# Patient Record
Sex: Male | Born: 1945 | Race: Black or African American | Hispanic: No | Marital: Single | Smoking: Never smoker
Health system: Southern US, Community
[De-identification: ages and names within clinical notes are randomized; demographics above are authoritative.]

## PROBLEM LIST (undated history)

## (undated) ENCOUNTER — Emergency Department (HOSPITAL_COMMUNITY): Payer: Medicare HMO | Source: Home / Self Care

## (undated) DIAGNOSIS — T7840XA Allergy, unspecified, initial encounter: Secondary | ICD-10-CM

## (undated) DIAGNOSIS — G709 Myoneural disorder, unspecified: Secondary | ICD-10-CM

## (undated) DIAGNOSIS — C801 Malignant (primary) neoplasm, unspecified: Secondary | ICD-10-CM

## (undated) DIAGNOSIS — M1990 Unspecified osteoarthritis, unspecified site: Secondary | ICD-10-CM

## (undated) DIAGNOSIS — I10 Essential (primary) hypertension: Secondary | ICD-10-CM

## (undated) DIAGNOSIS — H269 Unspecified cataract: Secondary | ICD-10-CM

## (undated) DIAGNOSIS — N189 Chronic kidney disease, unspecified: Secondary | ICD-10-CM

## (undated) DIAGNOSIS — E785 Hyperlipidemia, unspecified: Secondary | ICD-10-CM

## (undated) DIAGNOSIS — E119 Type 2 diabetes mellitus without complications: Secondary | ICD-10-CM

## (undated) DIAGNOSIS — K219 Gastro-esophageal reflux disease without esophagitis: Secondary | ICD-10-CM

## (undated) DIAGNOSIS — H409 Unspecified glaucoma: Secondary | ICD-10-CM

## (undated) DIAGNOSIS — N289 Disorder of kidney and ureter, unspecified: Secondary | ICD-10-CM

## (undated) HISTORY — DX: Chronic kidney disease, unspecified: N18.9

## (undated) HISTORY — PX: NEPHRECTOMY: SHX65

## (undated) HISTORY — DX: Unspecified glaucoma: H40.9

## (undated) HISTORY — DX: Essential (primary) hypertension: I10

## (undated) HISTORY — PX: FOOT SURGERY: SHX648

## (undated) HISTORY — DX: Unspecified osteoarthritis, unspecified site: M19.90

## (undated) HISTORY — DX: Myoneural disorder, unspecified: G70.9

## (undated) HISTORY — DX: Gastro-esophageal reflux disease without esophagitis: K21.9

## (undated) HISTORY — DX: Type 2 diabetes mellitus without complications: E11.9

## (undated) HISTORY — DX: Unspecified cataract: H26.9

## (undated) HISTORY — DX: Allergy, unspecified, initial encounter: T78.40XA

## (undated) HISTORY — DX: Hyperlipidemia, unspecified: E78.5

---

## 1999-07-06 ENCOUNTER — Encounter: Admission: RE | Admit: 1999-07-06 | Discharge: 1999-07-29 | Payer: Self-pay | Admitting: Orthopedic Surgery

## 1999-10-26 ENCOUNTER — Encounter: Admission: RE | Admit: 1999-10-26 | Discharge: 1999-10-26 | Payer: Self-pay | Admitting: Nephrology

## 1999-10-26 ENCOUNTER — Encounter: Payer: Self-pay | Admitting: Nephrology

## 2001-04-01 ENCOUNTER — Encounter: Payer: Self-pay | Admitting: Orthopedic Surgery

## 2001-04-01 ENCOUNTER — Encounter: Admission: RE | Admit: 2001-04-01 | Discharge: 2001-04-01 | Payer: Self-pay | Admitting: Orthopedic Surgery

## 2003-11-09 ENCOUNTER — Ambulatory Visit (HOSPITAL_COMMUNITY): Admission: RE | Admit: 2003-11-09 | Discharge: 2003-11-09 | Payer: Self-pay | Admitting: Gastroenterology

## 2003-11-09 ENCOUNTER — Encounter (INDEPENDENT_AMBULATORY_CARE_PROVIDER_SITE_OTHER): Payer: Self-pay | Admitting: Specialist

## 2006-08-31 ENCOUNTER — Ambulatory Visit (HOSPITAL_COMMUNITY): Admission: RE | Admit: 2006-08-31 | Discharge: 2006-08-31 | Payer: Self-pay | Admitting: Urology

## 2007-02-21 ENCOUNTER — Ambulatory Visit (HOSPITAL_COMMUNITY): Admission: RE | Admit: 2007-02-21 | Discharge: 2007-02-21 | Payer: Self-pay | Admitting: Urology

## 2007-06-10 ENCOUNTER — Inpatient Hospital Stay (HOSPITAL_COMMUNITY): Admission: RE | Admit: 2007-06-10 | Discharge: 2007-06-13 | Payer: Self-pay | Admitting: Urology

## 2007-06-10 ENCOUNTER — Encounter: Payer: Self-pay | Admitting: Urology

## 2008-01-14 ENCOUNTER — Inpatient Hospital Stay (HOSPITAL_COMMUNITY): Admission: EM | Admit: 2008-01-14 | Discharge: 2008-01-19 | Payer: Self-pay | Admitting: Emergency Medicine

## 2008-01-15 ENCOUNTER — Ambulatory Visit: Payer: Self-pay | Admitting: Infectious Disease

## 2008-02-04 ENCOUNTER — Ambulatory Visit: Payer: Self-pay | Admitting: Infectious Diseases

## 2008-02-04 DIAGNOSIS — L03119 Cellulitis of unspecified part of limb: Secondary | ICD-10-CM

## 2008-02-04 DIAGNOSIS — L02619 Cutaneous abscess of unspecified foot: Secondary | ICD-10-CM | POA: Insufficient documentation

## 2008-02-05 ENCOUNTER — Encounter: Payer: Self-pay | Admitting: Infectious Diseases

## 2008-02-07 ENCOUNTER — Encounter (HOSPITAL_BASED_OUTPATIENT_CLINIC_OR_DEPARTMENT_OTHER): Admission: RE | Admit: 2008-02-07 | Discharge: 2008-03-17 | Payer: Self-pay | Admitting: Surgery

## 2008-05-12 ENCOUNTER — Encounter: Payer: Self-pay | Admitting: Infectious Diseases

## 2008-05-15 ENCOUNTER — Encounter (HOSPITAL_BASED_OUTPATIENT_CLINIC_OR_DEPARTMENT_OTHER): Admission: RE | Admit: 2008-05-15 | Discharge: 2008-05-26 | Payer: Self-pay | Admitting: Internal Medicine

## 2008-06-01 ENCOUNTER — Encounter (HOSPITAL_BASED_OUTPATIENT_CLINIC_OR_DEPARTMENT_OTHER): Admission: RE | Admit: 2008-06-01 | Discharge: 2008-08-27 | Payer: Self-pay | Admitting: Internal Medicine

## 2008-06-08 ENCOUNTER — Telehealth: Payer: Self-pay | Admitting: Infectious Diseases

## 2008-09-03 ENCOUNTER — Encounter (HOSPITAL_BASED_OUTPATIENT_CLINIC_OR_DEPARTMENT_OTHER): Admission: RE | Admit: 2008-09-03 | Discharge: 2008-11-30 | Payer: Self-pay | Admitting: Internal Medicine

## 2010-03-09 ENCOUNTER — Ambulatory Visit (HOSPITAL_COMMUNITY): Admission: RE | Admit: 2010-03-09 | Discharge: 2010-03-09 | Payer: Self-pay | Admitting: Urology

## 2010-03-30 ENCOUNTER — Ambulatory Visit (HOSPITAL_COMMUNITY): Admission: RE | Admit: 2010-03-30 | Discharge: 2010-03-30 | Payer: Self-pay | Admitting: Urology

## 2010-09-18 ENCOUNTER — Encounter: Payer: Self-pay | Admitting: Urology
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
## 2011-01-10 NOTE — Consult Note (Signed)
NAMEABDULA, KLEBAN             ACCOUNT NO.:  [id number]   MEDICAL RECORD NO.:  [id number]           PATIENT TYPE:   LOCATION:                                 FACILITY:   PHYSICIAN:  Epifania Gore. Nils Pyle, M.D.DATE OF BIRTH:  09-07-1945   DATE OF CONSULTATION:  02/13/2008  DATE OF DISCHARGE:                                 CONSULTATION   SUBJECTIVE:  Mr. Rajkumar is a 65 year old man referred by Dr. Melina Schools for evaluation of a post-traumatic amputation of the first,  second, and third digits of the right foot.   ASSESSMENT:  Postop amputations with a clean surgical wound.   RECOMMENDATIONS:  Proceed with postoperative care as directed by Dr.  Duane Lope.  The patient is not currently a candidate for adjunctive  hyperbaric oxygen therapy and it appears that this wound should heal  within the global period of post operative management.  We have  explained this clinical impression to the patient in terms that he seems  to understand.   SUBJECTIVE:  Mr. Schmelzle is a 65 year old man who was seen in the [facility] Emergency Room 1 month ago with injury to his right foot from a  lawnmower.  The patient ultimately underwent completed amputations of  the first, second, and third digits of the right foot.  He was  discharged on p.o. antibiotics and a protective Cam walker.  He has been  seen in follow up on one occasion.  There has been no excessive  drainage, malodor, pain, or fever.   His past medical history is remarkable for a diagnosis of prostate  cancer and he has undergone a radical right nephrectomy approximately a  year and half ago.  His primary care physician is Dr. Ricke Hey  who he last saw approximately a year and half ago.  He was prescribed  Levaquin 750 mg one p.o. daily but could not afford the medication. He  has since started a course of Cipro and Bactrim.    He denies allergies.  His current medications include promethazine 25 mg p.o. q.6 h.,  methocarbamol 500 mg t.i.d. p.r.n., oxycodone/APAP 10 mg - 325 one to  two every 6 hours p.r.n., ciprofloxacin 500 mg b.i.d., and Bactrim DS  800 mg/160 p.o. b.i.d.   His family history is positive for breast cancer.   Socially, he is single.  He has adult children.  He is currently  unemployed.  He is receiving unemployment benefits.  He lives alone.   On review of systems, he has never smoked.  He denies visual changes or  transient paralysis.  He has had a weight loss of over 70 pounds over  the last year and half because of major changes in his diet.  He denies  chest pain or SOB.  There is no dysuria or hematuria.  There are no  arthralgias or myalgias.  He denies polydipsia, polyuria, or polyphagia.  His last PSA was 9.  The remainder of his review of systems is negative.   On physical exam, he is alert and anxious man who appears to be in good  contact with reality.  He is elusive on aspects of his history which  attempt to ascertain his current providers.  His blood pressure is  123/80, respirations 18, pulse rate 108, and temperature 98.5.  Inspection:  The HEENT exam is clear.  The neck is supple.  Trachea is  midline.  Thyroid is nonpalpable.  The lungs are clear.  The heart  sounds were normal.  The abdomen is soft.  There is bilateral 3+  dorsalis pedis pulses.  There is a surgical incision on the right foot  consistent with the amputations of the first, second, and third digits.  There is a darkened eschar with subdermal hemorrhage, but there is no  drainage and no malodor.  Neurologically, the patient retains protective  sensation as judged by the Semmes-Weinstein filament.  There is no  regional adenopathy.   DISCUSSION:  Mr. Bochenek apparently has had a completion amputations  which were initiated with trauma from a lawnmower injury.  He is  currently 1 month following the surgical procedure and continues on  antibiotics and a protective Cam walker.  There is no  evidence of a  wound complication at this point.  We have reassured the patient that it  appears that these injuries should resolve without the need for advanced  wound care techniques.  We have encouraged him to be compliant with Dr.  Valla Leaver instructions to avoid recurrent trauma to stay on the  antibiotics.  We have expressed a willingness to continue managing him  once he is beyond his obligatory global period related to his surgery.  Specifically, he is not a candidate for hyperbaric oxygen at this point  or other advance wound care procedures.   We have given the patient the opportunity to ask questions.  He seems to  understand the clinical impression and the recommendation.  He expresses  gratitude for having been seen in the clinic and indicates that he will  be compliant.      Harold A. Nils Pyle, M.D.  Electronically Signed     HAN/MEDQ  D:  02/13/2008  T:  02/14/2008  Job:  [id number]   cc:   Dahlia Bailiff, MD

## 2011-01-10 NOTE — Assessment & Plan Note (Signed)
[facility]   NAME:  [name], [name]             ACCOUNT NO.:  [id number]   MEDICAL RECORD NO.:  [id number]      DATE OF BIRTH:  1946/04/12   PHYSICIAN:  Ricard Dillon, M.D.      VISIT DATE:                                   OFFICE VISIT   [name] is an unfortunate man who suffered a foot versus lawnmower  accident to his right foot.  This resulted in amputations of multiple  toes on the right foot and it has left him with a wound in the base of  these toes.  The wound has had a problematic tendency to form a thick  yellow adherent eschar, which responds nicely to Santyl and debridement.  However, when I have tried to change this in the past, the eschar has  come back with a vengeance.  He returns today having simply been washing  this area with antibacterial soap, applying topical antibiotics, and a  bulky dressing and Ace wrap which he has been changing himself.   PHYSICAL EXAMINATION:  His temperature is 98.3.  The amputation site  measures 2.3 x 6 x 0.2.  This is an irregular wound at present.  The  medial aspect of this has more depth than the rest of the wound.  Once  again, this required a nonexcisional debridement to remove the eschar,  especially from this medial aspect of the wound, but also other areas.  Afterwards, the base of the wound appears granulating.  There are some  attempts at epithelialization, although all of this does not look too  much different from his last visit.   IMPRESSION:  Traumatic wound, right foot.  He is not a diabetic.  I have  applied Puracol, hydrogel, a bulky dressing, and an Ace wrap to this.  If we stall in terms of healing here, consider Oasis especially to the  medial aspect of this wound.  I should note that there was probably a  small amount of tendon joint through one aspect of this wound.  No  evidence of infection or ischemia.  We will see him again next week.            ______________________________  Ricard Dillon, M.D.     MGR/MEDQ  D:  06/29/2008  T:  06/29/2008  Job:  [id number]

## 2011-01-10 NOTE — Assessment & Plan Note (Signed)
[facility]   NAME:  [name], [name]             ACCOUNT NO.:  [id number]   MEDICAL RECORD NO.:  [id number]      DATE OF BIRTH:  [birth year]   PHYSICIAN:  Kathrin Penner, M.D.         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  Current dimensions 0.8 x 2.4 x 0.2 cm.  The wound  base is now fully granulated.  There are some callus formation around  the wound edge.  There is no odor or pain or drainage.   The patient is feeling generally well without any specific complaints  referable to his foot.  He thinks the wound is healing satisfactorily  finally.   I did selective debridement by removing the surrounding callus of the  wound.  The wound bed inspected, we will put above the dressing on and  continue him on daily washings with antibacterial soap and re-dressing  with a dry dressing.  I will follow up with him in approximately 3  weeks.  I anticipate that the wound will be healed by that time.      Kathrin Penner, M.D.  Electronically Signed     PB/MEDQ  D:  10/05/2008  T:  10/06/2008  Job:  [id number]

## 2011-01-10 NOTE — Discharge Summary (Signed)
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   SERVICE:  Dahari D. Rolena Infante, MD, Orthopedics Service.   CONSULTS:  Infectious disease consult was obtained.   PROCEDURE:  Completion amputation of the first, second and third toes  and a formal incision and drainage and wound closure.   BRIEF HISTORY:  [name] is a very pleasant 65 year old gentleman who  was mowing his lawn earlier in the afternoon of Jan 14, 2008.  Unfortunately, he states he slipped, fell on a rock and fell into the  mower and it rolled back onto his right foot.  He had immediate pain and  deformity.  He contacted a friend who notified the EMS and he was  brought to the emergency room.  He initially presented into the  emergency room with a significant soft tissue laceration with amputation  of the first, second and third toes and a orthopedic consultation was  requested.  Dr. Rolena Infante came and evaluated the patient in the operating  room and the patient was deemed stable to proceed with completion of his  amputation and wound washout and closure of his lacerations.   HOSPITAL COURSE:  The patient's hospital course was approximately 5 days  in length.  After the patient's initial surgery on Jan 14, 2008, the  patient was transferred from the PACU to the ortho floor in a stable  condition.  The patient tolerated the procedure very well with no  complications.  The patient was initially placed on Ancef and  gentamicin  to be dosed up per pharmacy to cover for an osteomyelitis.  An  Infectious Disease consult was obtained and it was recommended that he  be changed to Zosyn and vancomycin.  Postoperatively on day #2, the  patient's dressings were taken down.  The incision appeared clean, dry  and intact.  There was no erythema present.  The patient was able to  move his fourth and fifth toes, and there was good capillary refill.  The remainder of his distal pulses remained intact.  The patient was  placed in a Cam Walker, and the patient was instructed to work with  physical therapy and ambulating with minimal weightbearing in the [employer].  Throughout his stay, the patient's dressings remained dry with  no drainage present.  The patient remained afebrile.  His vital signs  were stable, and the patient was gradually transitioned off IV  antibiotics on to oral Levaquin.  Postoperatively at day #5, the patient  was deemed stable to go home.  He was tolerating  a regular diet, heart  rate was regular rate and rhythm.  Chest was clear to auscultation.  Abdomen was soft and nontender.  The patient had no episodes of  shortness of breath or chest pain.  The patient was then neurovascularly  intact and the patient was afebrile and vital signs were stable.   DISPOSITION:  The patient is discharged to home with a home health nurse  for regular wound checks.   MEDICATIONS:  The patient did not have any current medications when he  arrived in the hospital.  The patient is being discharged on  1. Percocet 10/325 one-two tablets p.o. q.6 h. p.r.n. pain.  2. Robaxin 500 mg one tablet p.o. t.i.d. p.r.n. muscle spasms.  3. Levaquin 750 mg one tablet p.o. daily for 14 days.   DISCHARGE INSTRUCTIONS:  The patient is to remain in his Cam Walker.  He  is to use his crutches.  The patient is not to bear weight until he  follows up in the office in 4 days.  The patient is allowed to have a  regular diet.  The  patient is to have daily dressing changes and the  patient is to continue taking his antibiotic and return to Dr. Rolena Infante'  office in 4 days for follow-up.      Jacob Kays, PA      Dahlia Bailiff, MD  Electronically Signed    AC/MEDQ  D:  03/06/2008  T:  03/07/2008  Job:  [id number]

## 2011-01-10 NOTE — H&P (Signed)
NAMEMARLO, [name]             ACCOUNT NO.:  [id number]   MEDICAL RECORD NO.:  [id number]          PATIENT TYPE:  INP   LOCATION:  1405                         FACILITY:  [facility]   PHYSICIAN:  Lillette Boxer. Dahlstedt, M.D.DATE OF BIRTH:  [birth year]   DATE OF ADMISSION:  06/10/2007  DATE OF DISCHARGE:                              HISTORY & PHYSICAL   REASON FOR ADMISSION:  Here for surgery.   PRESENT ILLNESS:  Was done in this 65 year old male who was initially  evaluated for an elevated PSA.  This was 10.7.  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  He  underwent a bone scan which was negative.  Because of the high volume of  disease, CT scan of the abdomen and pelvis was obtained.  This revealed  a very large right cystic renal mass with a simple upper pole cyst.  This was in the right kidney.  There was no associated metastatic  disease.  The patient was asymptomatic.  He opted for an attempt at  right laparoscopic radical nephrectomy.  The patient denies any current  voiding symptoms.  He has opted nonoperative management for his prostate  cancer.   REVIEW OF SYSTEMS:  Multisystem reviews performed is negative for all  symptoms except as in HPI.   ALLERGIES:  NO KNOWN DRUG ALLERGIES.   MEDICATIONS:  1. Saw palmetto  2. Vitamin C.  3. Vitamin D.  4. Avodart.  Past.   MEDICAL HISTORY:  Prostate cancer.   PAST SURGICAL HISTORY:  None.   SOCIAL HISTORY:  The patient denies any ethanol or tobacco abuse.  He is  unmarried.   FAMILY HISTORY:  Negative for diabetes, hypertension, genitourinary  malignancy.   PHYSICAL EXAMINATION:  VITAL SIGNS:  Afebrile with stable vitals.  GENERAL:  This is an [place] American gentleman in no acute distress.  HEENT:  The pupils are equally round, reactive to light.  NECK: Supple.  No lymphadenopathy.  CHEST: Clear to auscultation.  CARDIAC:  Regular rate rhythm.  ABDOMEN:  Soft, nontender,  nondistended.  There is a palpable mass in  the right upper quadrant at the level of the costal margin.  This  appears to be mobile.  GENITOURINARY:  Penis is uncircumcised.  Testes descended bilaterally.  Normal in contour.  EXTREMITIES: Warm and well-perfused.  SKIN:  No rashes or lesions.   IMAGING:  Contrasted CT scan of the abdomen and pelvis is reviewed with  the findings as above, namely a large right renal mass extending down  near the level of the iliac crest.   PLAN:  The patient will be taken to the operating room for an attempt at  right laparoscopic radical nephrectomy.  He will be admitted  postoperatively for a period of observation.  The patient has been  informed of the risks and benefits of procedure.  Signed consent form,  found on the chart.     ______________________________  Danella Deis. Dahlstedt, M.D.  Electronically Signed    [name]  D:  06/10/2007  T:  06/11/2007  Job:  [id number] 

## 2011-01-10 NOTE — H&P (Signed)
[name], DU             ACCOUNT NO.:  [id number]   MEDICAL RECORD NO.:  [id number]          PATIENT TYPE:  INP   LOCATION:  5013                         FACILITY:  [facility]   PHYSICIAN:  Dahlia Bailiff, MD    DATE OF BIRTH:  1945-08-31   DATE OF ADMISSION:  01/14/2008  DATE OF DISCHARGE:                              HISTORY & PHYSICAL   ADMITTING DIAGNOSES:  Traumatic amputation of the right foot.   HISTORY:  [name] is a very pleasant 65 year old gentleman who is  otherwise healthy.  He was mowing his lawn earlier this afternoon.  He  states that he slipped on a rock, fell in the mower, rolled back onto  his right foot.  He had immediate pain and deformity.  He contacted a  friend who brought him to the emergency room.  He presents with a  significant soft tissue laceration with amputation of the first, second,  and third toes.  As a result of this, orthopedic consultation was  requested.   The patient's last meal was approximately at 11 o'clock this morning.  He has a history of questionable renal cancer versus hydronephrosis.  He  is unclear, but he has had a right kidney nephrectomy.  He is otherwise  healthy.  No other significant medical problems.  He has had a previous  tracheotomy 30 years ago secondary to trauma.  He is otherwise healthy.  He denies hypertension, coronary artery disease, diabetes, and pulmonary  issues.   PAST MEDICAL HISTORY:   MEDICATIONS:  He is on no known medications.   ALLERGIES:  He denies any drug allergies.   PHYSICAL EXAMINATION:  GENERAL:  He is a pleasant gentleman who appears  his stated age.  He is in no acute distress.  He is comfortable at  present.  He has no hip, knee, or ankle pain on the right side with  range of motion and palpation.  LUNGS:  He denies any shortness of breath or chest pain.  ABDOMEN:  Soft and nontender.  EXTREMITIES:  He has intact dorsalis pedis and posterior tibialis  pulses.  After removing the  dressing, the fourth and fifth toes are  grossly intact.  It did not appear to be involved.  He is able to move  them.  He has sensation to light touch over the dorsum and sole of his  foot.  There is a traumatic amputation of the second and third what  appears to be MTP head, it is unclear because of the bleeding.  The  first toe has been completely amputated and there is a soft tissue  laceration that comes along the medial border of the foot.   PLAN:  At this point in time, I have discussed treatment options with  the patient.  Reimplantation is not an option as he was requesting.  At  this point, my plan would be to take him to the operating room to do a  thorough irrigation and debridement to decrease his risk of infection  because of the questionable soil contamination, also request pharmacy  for two-day gentamicin dosing.  I will  have him evaluated by the wound  nurse tomorrow and will plan on a repeat  I&D in 24 hours.  We will plan on doing a formal I&D and a temporary  wound closure tonight.  All the risks, benefits, and alternatives to  surgery were discussed with the patient.  He consented to the completion  amputation and I&D.      Dahlia Bailiff, MD  Electronically Signed     DDB/MEDQ  D:  01/14/2008  T:  01/15/2008  Job:  [id number]

## 2011-01-10 NOTE — Op Note (Signed)
NAMETYRANCE, BRANIGAN             ACCOUNT NO.:  [id number]   MEDICAL RECORD NO.:  [id number]          PATIENT TYPE:  INP   LOCATION:  1405                         FACILITY:  [facility]   PHYSICIAN:  Lillette Boxer. Dahlstedt, M.D.DATE OF BIRTH:  Sep 27, 1945   DATE OF PROCEDURE:  06/10/2007  DATE OF DISCHARGE:                               OPERATIVE REPORT   PREOPERATIVE DIAGNOSIS:  Right renal mass.   POSTOPERATIVE DIAGNOSIS:  Right renal mass.   PROCEDURES PERFORMED:  Attempted right laparoscopic radical nephrectomy,  conversion to open radical nephrectomy.   SURGEON:  Lillette Boxer. Diona Fanti, M.D.   ASSISTANT:  Aron Baba, M.D.   ANESTHESIA:  General endotracheal.   ESTIMATED BLOOD LOSS:  400 mL.   COMPLICATIONS:  Conversion to open.   DRAIN:  An 70 French Foley catheter.   INDICATIONS FOR PROCEDURE:  Please see full history of physical dated  June 10, 2007, for details.  Briefly, Mr. Buehring has a large right  renal mass that was discovered on metastatic survey for low volume  prostate cancer for which he has chosen watchful waiting.  He does  desires surgical extirpation of his renal mass.  He presents for that  today.  His metastatic service is negative.   DESCRIPTION OF PROCEDURE IN DETAIL:  The patient is brought to the  operating, previously identified by his armband.  Informed consent was  verified and preoperative time-out was performed.  After the successful  induction of general endotracheal anesthesia, a Foley catheter was  inserted transurethrally into the bladder and placed to straight drain.  The patient was then moved to the flank position.  The table was flexed.  All appropriate pressure points were padded to avoid apraxic compartment  syndrome.  The operative site was prepped and draped in usual fashion.  We began by making a 12 mm infraumbilical incision.  Blunt and sharp  dissection was used to carry this down to the external oblique fascia  which was  incised with Bovie cautery.  We then placed a 12 mm Hasson  port.  We then established pneumoperitoneum with carbon dioxide.  Once  this was completed, we placed a right lower quadrant 12 mm port and then  a 5 mm working port in the midclavicular line.  We then performed  laparoscopy.  Upon entering the abdomen there were no adhesions.  There  was a very large mass arising from the right kidney.  It was adherent to  the colon.  We did identify an area where there was normal anatomy  inferiorly.  We incised the white line of Toldt and developed the  avascular plane using the Harmonic scalpel as well as blunt dissection.  We then followed this plane onto the kidney and dissected the colon off  of the kidney.  This was placed on traction.  Because of limited  mobility, we placed a second 5 mm port just below the xiphoid process.  This was used to retract the colon medially.  We then proceeded to  further mobilize the colon off of the kidney with particular attention  to the inferior pole.  Once  adequate reflection of colon had been  obtained, the dissection was carried laterally and posteriorly toward  the psoas major muscle.  We identified the gonadal vessels and the right  ureter.  These were placed on the anterior traction.  There were  dissected and skeletonized.  They were then clipped with Hem-o-lok clips  and divided.  We then placed the kidney on this much upper traction as  possible and developed the posterior plane.  This was difficult  secondary to the size of the kidney and the tumor. There was difficulty  gaining much anterior traction.  We then identified the duodenum.  This  was kocherized.  Once the duodenum was reflected, we continued from a  caudal to cephalad direction in the posterior plane in an attempt to  find the vessels.  Again, it was very difficult to identify the vessels  in a safe manner because of the lack of traction.  We attempted to  mobilize the kidney  further to allow for more traction and adequate  exposure.  We divided the lateral attachments between Gerota's fascia  and the peritoneum.  We divided the attachments between the liver and  the kidney superiorly as much as possible.  Again, this difficult to  visualize.  We then proceeded with the superior dissection and spared  the adrenal by entering Gerota's fascia medially and superiorly and  developing the plane between the renal capsule and the adrenal within  Gerota's fascia.  Having completed all this mobilization, we again  returned to the presumptive renal hilum.  We were unable to visualize it  in a satisfactory manner.  The decision was made at this point to insert  a GelPort to continue in a hand assisted laparoscopic fashion.  The  right lower quadrant incision was extended to 8 cm.  This was carried  down through the fascia under direct vision.  The GelPort was placed.  We again tried for a significant amount of time to gain adequate  exposure of the renal hilum.  The kidney was nearly completely mobilized  at this point, but because of inadequate working space due to the size  of the tumor, we were unable to identify the renal hilum, in particular,  the right renal artery.  The decision was then made to convert to an  open procedure.  An anterior subcostal incision was made and a  Bookwalter retractor was placed.  We were able to complete the medial  dissection.  We identified the vena cava.  We identified the renal vein  coming off of the vena cava.  The anatomy was somewhat distorted in  caudal fashion due to the size of this tumor.  All of the attachments  were freed up and the kidney remained on the pedicle.  We could identify  the vena cava as well as the aorta.  We identified the presumptive renal  pedicle.  At this time, the decision was made to complete the  nephrectomy with an Endo-GIA stapler.  We used a 55 mm Endo-GIA vascular  load.  It was placed across the  renal hilum.  We inspected the vena cava  as well as the duodenum.  We verified that no structures other than the  renal hilum were enclosed within the stapler.  It was then fired.  The  kidney was freely mobile.  We were able to remove the kidney, although  we did have to extend our subcostal incision due to the size.  We  inspected  the hilum and hemostasis was excellent.  The wound was  irrigated.  The infraumbilical fascia was closed with interrupted  mattress sutures of 0 Vicryl.  Both the hand port and the anterior  subcostal incision were closed in two layers with running #1 PDS.  Subcutaneous portions of the wound were irrigated and the skin was  closed with surgical staples including the remaining laparoscopic port.  At this time the procedure was terminated.  The patient tolerated the  procedure well.  He was transferred to the postanesthesia care unit in  satisfactory condition.     ______________________________  Aron Baba, M.D.      Lillette Boxer. Dahlstedt, M.D.  Electronically Signed    JR/MEDQ  D:  06/10/2007  T:  06/11/2007  Job:  [id number]

## 2011-01-10 NOTE — Assessment & Plan Note (Signed)
[facility]   NAME:  [name], [name]             ACCOUNT NO.:  [id number]   MEDICAL RECORD NO.:  [id number]      DATE OF BIRTH:  [birth year]   PHYSICIAN:  Ermalene Searing. Philip Aspen, M.D.     VISIT DATE:                                   OFFICE VISIT   SUBJECTIVE:  The patient is a 65 year old man of African American  descent who is status post a lawnmower accident to his right foot that  resulted in traumatic amputation of multiple toes including the great  toe and left him with a wound at the base of these toes.  At his last  visit, he was treated with Oasis covered by Mepitel, silver alginate  dressing, and a bulky gauze dressing.  He has continued to use a healing  sandal.  He has not had significant pain in the foot or fever or chills.   OBJECTIVE:  VITAL SIGNS:  Blood pressure 133/84, pulse 99, respirations  20, temperature 98.  The condition of the wound is as follows:  Wound #2:  It is located on the stump of the right foot, traumatic  amputation of multiple toes.  It measures 2 cm x 5.6 cm x no significant  depth.  There is no undermining of the wound.  There is no significant  eschar and minimal necrotic debris, primarily on the periphery of the  wound.  There is a red wound base and hypergranulation tissue.  There is  no exposed bone, tendon, or muscle.  There was no foul odor and a scant  amount of serosanguineous discharge.  The periwound area was intact.   IMPRESSION:  Interval marked improvement in appearance of the wound with  wound healing stimulated by the first Oasis application.   PLAN:  After 5% lidocaine gel was applied to the wound, a #15 scalpel  was used to gently debride the minimal amount of necrotic material from  the wound.  He tolerated this well without significant pain or bleeding.  Following this, Oasis material was applied (it took 2 strips).  The  Oasis was then treated with saline moisturizer and covered with Steri-  Strips, then  Mepitel, then a bulky dressing, then an Ace wrap.  He  should be seen in followup in approximately 1 week.           ______________________________  Ermalene Searing. Philip Aspen, M.D.     DGP/MEDQ  D:  07/14/2008  T:  07/14/2008  Job:  [id number]

## 2011-01-10 NOTE — Assessment & Plan Note (Signed)
[facility]   NAME:  [name], [name]             ACCOUNT NO.:  [id number]   MEDICAL RECORD NO.:  [id number]      DATE OF BIRTH:  Mar 17, 1946   PHYSICIAN:  Orlando Penner. Sevier, M.D.       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  Originally, his wound  was quite deep, but it has progressed to be healed and is now  superficial with no deep penetration.  He has been treating most  recently simply with every other day application of Iodosorb and has  seen some continued progress.   Now, he has had no fever, chills, systemic symptoms.  Does notice some  drainage, but does not feel it has increased.  There is no odor and no  undue pain.   PHYSICAL EXAMINATION:  VITAL SIGNS:  Blood pressure is 150/83, pulse  109, respirations 18, and temperature 98.3.   The wound measures 4.0 x 2.0 x 0.3 cm in its entirely and has some  callus formation at the margins and also some slough particularly on the  lateral into the wound.  There is no odor and no significant drainage.   IMPRESSION:  Gradual improvement, traumatic wound right lower extremity.   DISPOSITION:  The wound is sharply debrided under the protection of 5%  lidocaine gel with the callus being removed and the wound edges  stimulated and with the slough in the lateral aspect of the wound  removed as well.  This leaves a good granular base in all areas.   Following tamponade to control the small amount of bleeding, the wound  was then treated with an application of Iodosorb and covered with a  sterile dressing and the forefoot placed in a bulky wrap.  He continues  on healing sandal.   The patient will continue his protocol of cleansing the wound and  reapplying Iodosorb every 2 days.   FOLLOWUP:  This will be in 2 weeks.           ______________________________  Orlando Penner  London Pepper, M.D.     RES/MEDQ  D:  08/25/2008  T:  08/26/2008  Job:  [id number]

## 2011-01-10 NOTE — Op Note (Signed)
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   PROCEDURE:  Repeat I&D and wound closure.   HISTORY:  This is a very pleasant 65 year old gentleman who injured  himself Tuesday afternoon, as he was mowing his lawn.  He was initially  taken to the operating room for formal I&D and temporary wound closure  at that time.  He returns today for repeat I&D and final wound closure.   COMPLICATIONS:  None.   CONDITION:  Stable.   OPERATIVE NOTE:  The patient is brought to the operating room, placed  supine on the operating table.  After successful induction of general  anesthesia and laryngeal anesthesia, and LMA anesthesia, the right lower  extremity was prepped and draped in standard fashion.  The previous  sutures were removed and using 3 L pulsatile lavage, I irrigated  copiously the wound.  There was no significant purulent material.  After  probing, there was no significant evidence of ongoing gross  contamination.  At this point, I elected to close with combination of  vertical and horizontal mattress #1 PDS sutures.  This allowed me to  take the tension off the wound edges.  I then supplemented this with  simple 3-0 nylon sutures.  The wound closed together nicely without  significant undue tension.  A bulky dry dressing was reapplied as was an  Ace wrap.  The patient was extubated and transferred to the PACU without  incident.  At the end of the case, all needle  and sponge counts were  correct.      Dahlia Bailiff, MD  Electronically Signed     DDB/MEDQ  D:  01/16/2008  T:  01/17/2008  Job:  [phone] [id number]

## 2011-01-10 NOTE — Assessment & Plan Note (Signed)
[facility]   NAME:  [name], [name]             ACCOUNT NO.:  [id number]   MEDICAL RECORD NO.:  [id number]      DATE OF BIRTH:  1946-05-28   PHYSICIAN:  Ricard Dillon, M.D.      VISIT DATE:                                   OFFICE VISIT   This is a 65 year old man who had a foot versus lawn mower amputation of  his right first and second toes.  Originally, the wound was quite deep  but has progressed to fill the wound in his more superficial with the  help of [facility].  I have been using complicated dressings on him for  awhile; however, the logistics of this financially were impossible for  him to maintain.  Therefore recently, he has been using Iodosorb with a  bulky dressing and in spite of this, this has made reasonably good  improvements in the wound dimensions.  The last visit here on August 25, 2008, it was 4 x 2 x 0.3, currently it is 3.8 x 1.5 x 0.3.  There is  a recess to this wound on its medial aspect.  Even this has been filling  in.  I lightly removed some adherent eschar from the recess of this  wound.  The rest of it remained healthy and there is a rim of  epithelialization.  Callus was also removed to free the edges for  further epithelialization.   IMPRESSION:  Traumatic wound of the right foot.  We continued the  Iodosorb bulky dressing change daily or every-other-day regimen.  We  will see him in 2 weeks.  He appears to be making progress.           ______________________________  Ricard Dillon, M.D.     MGR/MEDQ  D:  09/07/2008  T:  09/08/2008  Job:  [phone] [id number]

## 2011-01-10 NOTE — Assessment & Plan Note (Signed)
[facility]   NAME:  [name], [name]             ACCOUNT NO.:  [id number]   MEDICAL RECORD NO.:  [id number]      DATE OF BIRTH:  07-18-1946   PHYSICIAN:  Ricard Dillon, M.D. VISIT DATE:  06/22/2008                                   OFFICE VISIT   [name] is a gentleman we have been following for a traumatic wound  in his right foot secondary to a foot versus lawn mower accident.  This  is resulted in amputation of his first, second, and third digits of the  right foot and a nonhealing area at the amputation site.  We have been  applying Santyl to the wounds most recently with a bulky dressing and an  ACE wrap.  He returns today in followup.   On examination, the temperature is 98.2.  The area looks considerably  better each time I see this.  There is less yellowish eschar.  I did do  a debridement of some eschar over the wound bed, but in generally this  is a well granulated wound now with evidence of advancing  epithelialization.   IMPRESSION:  Traumatic wound right foot.  He underwent a debridement  noted above.  I do not think there is any need for Santyl at this point,  although we have had trouble with recurrence of the adherent yellow  eschar in the past.  I have advised him to wash this area with  antibacterial soap to apply triple antibiotics, a bulky dressing, and an  ACE wrap.  He is to continue in his healing sandal.  We will see this  again in a week's time.           ______________________________  Ricard Dillon, M.D.     MGR/MEDQ  D:  06/22/2008  T:  06/22/2008  Job:  [id number]

## 2011-01-10 NOTE — Assessment & Plan Note (Signed)
[facility]   NAME:  ZARREN, PENFOLD             ACCOUNT NO.:  [id number]   MEDICAL RECORD NO.:  [id number]      DATE OF BIRTH:  10-29-1945   PHYSICIAN:  Ermalene Searing. Philip Aspen, M.D.     VISIT DATE:                                   OFFICE VISIT   SUBJECTIVE:  The patient is a 65 year old man of African American  descent who suffered a foot versus lawnmower accident to his right foot  that resulted in amputations of multiple toes and a wound at the base of  these toes.  At his last visit, he had debridement of necrotic tissue  followed by application of Puracol, hydrogel, a bulky dressing, and an  Ace wrap.  Since that time, he has been using a healing sandal and has  not had fever, chills, or worsening drainage from the wound.   OBJECTIVE:  VITAL SIGNS:  Blood pressure 151/91, pulse 86, respirations  16, and temperature 98.6.  The condition of the wound is as follows:  Location of the wound is on the right foot on the medial aspect where  the first, second, and third toes had traumatic amputation.  This wound  measures 2.4 cm x 5.9 cm x 0.3 cm.  There is no significant eschar.  There is a minimal amount of necrotic debris, and the wound base has a  red to pale pink color.  There is some red granulation tissue and no  exposed bone, tendon, or muscle.  There is mild odor to the wound and a  moderate amount of serosanguineous drainage.  Periwound integrity is  intact.   ASSESSMENT:  Traumatic wound to the right foot without significant  improvement.   PLAN:  After 5% lidocaine ointment was applied to the wound for 30  minutes, a #15 scalpel was used to debride the minimal amount of  necrotic material on the wound via selective debridement.  This was not  associated with pain or significant bleeding.  Following this, OASIS  material was placed to cover the wound and then saline was used to  moisten OASIS.  The material was then held down with Steri-Strips after  tincture of Betadine was used to make an adherent surface with Steri-  Strips.  Then a Mepitel, followed by silver alginate, followed by bulky  gauze dressing were applied.  He is to continue using a healing sandal  and return in approximately 1 week for a physician reevaluation.           ______________________________  Ermalene Searing Philip Aspen, M.D.     DGP/MEDQ  D:  07/07/2008  T:  07/07/2008  Job:  [id number]

## 2011-01-13 NOTE — Op Note (Signed)
NAME:  [name], FRIEDEL                       ACCOUNT NO.:  [id number]   MEDICAL RECORD NO.:  [id number]                   PATIENT TYPE:  AMB   LOCATION:  ENDO                                 FACILITY:  [facility]   PHYSICIAN:  Wonda Horner, M.D.                DATE OF BIRTH:  03-08-1946   DATE OF PROCEDURE:  11/09/2003  DATE OF DISCHARGE:                                 OPERATIVE REPORT   PROCEDURE:  Colonoscopy with biopsy.   INDICATIONS FOR PROCEDURE:  Family history of colon polyps.   CONSENT:  Informed consent was obtained after explanation of the risks of  bleeding, infection, and perforation.   PREMEDICATION:  Fentanyl 62.5 mcg IV, Versed 6 mg IV.   DESCRIPTION OF PROCEDURE:  With the patient in the left lateral decubitus  position, the rectal exam was performed.  No masses were felt.  The Olympus  colonoscope was inserted into the rectum and advanced around the colon to  the cecum.  Cecal landmarks were identified.  The cecum and ascending colon  were normal.  In the  transverse colon there was a small 3-4 mm sessile  polyp biopsied off with cold forceps.  The descending colon, sigmoid and  rectum were normal.  He tolerated the procedure well without complications.   IMPRESSION:  One small polyp in the transverse colon.   PLAN:  Pathology will be checked.                                               Wonda Horner, M.D.    Katherina Mires  D:  11/09/2003  T:  11/09/2003  Job:  [id number]

## 2011-01-13 NOTE — Discharge Summary (Signed)
[name], [name]             ACCOUNT NO.:  [id number]   MEDICAL RECORD NO.:  [id number]          PATIENT TYPE:  INP   LOCATION:  1405                         FACILITY:  [facility]   PHYSICIAN:  Lillette Boxer. Dahlstedt, M.D.DATE OF BIRTH:  09/23/1945   DATE OF ADMISSION:  06/10/2007  DATE OF DISCHARGE:  06/13/2007                               DISCHARGE SUMMARY   ADMISSION DIAGNOSIS:  Right renal mass.   DISCHARGE DIAGNOSIS:  Right renal mass.   PROCEDURE PERFORMED:  Attempted right laparoscopic radical nephrectomy  with conversion to open radical nephrectomy.   ATTENDING PHYSICIAN:  Lillette Boxer. Dahlstedt, M.D.   HISTORY OF PRESENT ILLNESS:  Please see full dictated H&P for details.  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  He  currently has undergone no primary therapy for this.  The patient had  high-volume disease on biopsy despite the low Gleason grade. Because of  this, he underwent a CT scan of the abdomen and pelvis which revealed a  very large right cystic renal mass and a simple upper pole cyst in the  right kidney.  He presents for operative management.   HOSPITAL COURSE:  The patient was admitted to the hospital on the day  surgery.  He was taken to the operating room for an attempted right  laparoscopic nephrectomy.  This was converted to open nephrectomy  because of the size of the mass.  The patient tolerated the tolerated  the procedure well and was admitted to the floor from the post  anesthesia care unit.  He had a Foley catheter and a patient-controlled  analgesia pump.  The remainder of his hospital course was unremarkable.  His Foley catheter was removed on postoperative day #1 and he voided  without complaints.  Upon passing flatus, the patient was begun on a  clear liquid diet and advanced to regular.  When tolerating clear  liquids, his PCA was removed and he was  begun on oral analgesics.  On  postop day #3, the patient was ambulatory, tolerating a regular diet.  His pain was controlled with p.o. pain medication.  His incisions were  clean, dry, and intact.  At this time, it was felt the patient was  stable for discharge to home.   DISCHARGE MEDICATIONS:  1. Vicodin.  2. Colace.  3. Resume previous medications.   DISCHARGE INSTRUCTIONS:  The patient was told to resume his previous  diet.  He is up as tolerated.  He is to avoid lifting anything greater  than 10 pounds and avoid any strenuous activity. He is follow-up with  Franchot Gallo as instructed. The patient indicated he understood  these instructions and is amendable to discharge home.     ______________________________  Aron Baba, MD      Lillette Boxer. Dahlstedt, M.D.  Electronically Signed    JR/MEDQ  D:  06/18/2007  T:  06/19/2007  Job:  [id number]

## 2011-04-21 ENCOUNTER — Emergency Department (HOSPITAL_COMMUNITY): Payer: Self-pay

## 2011-04-21 ENCOUNTER — Emergency Department (HOSPITAL_COMMUNITY)
Admission: EM | Admit: 2011-04-21 | Discharge: 2011-04-21 | Disposition: A | Discharge status: Home / Self Care | Payer: Self-pay | Attending: Emergency Medicine | Admitting: Emergency Medicine

## 2011-04-21 ENCOUNTER — Encounter (HOSPITAL_COMMUNITY): Payer: Self-pay | Admitting: Radiology

## 2011-04-21 DIAGNOSIS — E2749 Other adrenocortical insufficiency: Secondary | ICD-10-CM | POA: Insufficient documentation

## 2011-04-21 DIAGNOSIS — Z85528 Personal history of other malignant neoplasm of kidney: Secondary | ICD-10-CM | POA: Insufficient documentation

## 2011-04-21 DIAGNOSIS — Z8546 Personal history of malignant neoplasm of prostate: Secondary | ICD-10-CM | POA: Insufficient documentation

## 2011-04-21 DIAGNOSIS — R1310 Dysphagia, unspecified: Secondary | ICD-10-CM | POA: Insufficient documentation

## 2011-04-21 HISTORY — DX: Disorder of kidney and ureter, unspecified: N28.9

## 2011-04-21 HISTORY — DX: Malignant (primary) neoplasm, unspecified: C80.1

## 2011-04-21 LAB — POCT I-STAT, CHEM 8
Creatinine, Ser: 2 mg/dL — ABNORMAL HIGH (ref 0.50–1.35)
Glucose, Bld: 91 mg/dL (ref 70–99)
Potassium: 4.6 mEq/L (ref 3.5–5.1)
TCO2: 23 mmol/L (ref 0–100)

## 2011-05-05 ENCOUNTER — Ambulatory Visit: Payer: Self-pay | Admitting: Gastroenterology

## 2011-05-17 ENCOUNTER — Ambulatory Visit: Payer: Self-pay | Admitting: Gastroenterology

## 2011-05-24 LAB — CBC
HCT: 34.6 — ABNORMAL LOW
Hemoglobin: 12.5 — ABNORMAL LOW
MCHC: 34.8
MCV: 96.9
MCV: 98
Platelets: 162
Platelets: 226
RBC: 3.53 — ABNORMAL LOW
RDW: 13.5
RDW: 13.6

## 2011-05-24 LAB — POCT I-STAT, CHEM 8
BUN: 29 — ABNORMAL HIGH
Calcium, Ion: 1.22
Chloride: 108
Creatinine, Ser: 2.1 — ABNORMAL HIGH
Glucose, Bld: 89
HCT: 39
Potassium: 4.5
TCO2: 22

## 2011-05-24 LAB — DIFFERENTIAL
Eosinophils Absolute: 0.3
Lymphocytes Relative: 22
Lymphocytes Relative: 29
Lymphs Abs: 2
Lymphs Abs: 2
Monocytes Absolute: 0.6
Monocytes Relative: 11
Neutro Abs: 5.7
Neutrophils Relative %: 59
Neutrophils Relative %: 63

## 2011-05-24 LAB — URINALYSIS, ROUTINE W REFLEX MICROSCOPIC
Ketones, ur: NEGATIVE
Nitrite: NEGATIVE
pH: 6.5

## 2011-05-24 LAB — PROTIME-INR: Prothrombin Time: 12.6

## 2011-05-24 LAB — BASIC METABOLIC PANEL
Calcium: 8.7
GFR calc Af Amer: 48 — ABNORMAL LOW
Sodium: 137

## 2011-05-24 LAB — RPR: RPR Ser Ql: NONREACTIVE

## 2011-06-05 ENCOUNTER — Ambulatory Visit (INDEPENDENT_AMBULATORY_CARE_PROVIDER_SITE_OTHER): Payer: Self-pay | Admitting: Sports Medicine

## 2011-06-05 ENCOUNTER — Encounter: Payer: Self-pay | Admitting: Sports Medicine

## 2011-06-05 VITALS — BP 145/82 | HR 91 | Temp 98.4°F | Ht 72.0 in | Wt 284.0 lb

## 2011-06-05 DIAGNOSIS — K219 Gastro-esophageal reflux disease without esophagitis: Secondary | ICD-10-CM

## 2011-06-05 MED ORDER — OMEPRAZOLE 40 MG PO CPDR: 40.0000 mg | DELAYED_RELEASE_CAPSULE | Freq: Every day | ORAL | Status: DC

## 2011-06-05 NOTE — Patient Instructions (Signed)
It was nice to meet you today.  I think that what you are experiencing is GERD - Gastroesophageal Reflux Disease.  I have a prescribed omeprazole and it should be waiting for you at [facility] if there are any problems with your prescription call our office.  Please take this medication as prescribed for the entire month.    We will plan to see you back in 2-4 weeks to follow up on your reflux and to discuss some of your other medical issues.  Please fill out the new patient health history form that I gave you and bring it and all of your medications with you to your next office visit.    Diet for GERD or PUD Nutrition therapy can help ease the discomfort of gastroesophageal reflux disease (GERD) and peptic ulcer disease (PUD).   HOME CARE INSTRUCTIONS  Eat your meals slowly, in a relaxed setting.   Eat 5 to 6 small meals per day.   If a food causes distress, stop eating it for a period of time.  FOODS TO AVOID:  Coffee, regular or decaffeinated.   Cola beverages, regular or low calorie.     Tea, regular or decaffeinated.     Pepper.    Cocoa.    High fat foods including meats.     Butter, margarine, hydrogenated oil (trans fats).   Peppermint or spearmint (if you have GERD).     Fruits and vegetables as tolerated.     Alcoholic beverages.     Nicotine (smoking or chewing). This is one of the most potent stimulants to acid production in the gastrointestinal tract.     Any food that seems to aggravate your condition.     If you have questions regarding your diet, call your caregiver's office or a registered dietitian. OTHER TIPS IF YOU HAVE GERD:  Lying flat may make symptoms worse. Keep the head of your bed raised 6 to 9 inches by using a foam wedge or blocks under the legs of the bed.   Do not lay down until 3 hours after eating a meal.   Daily physical activity may help reduce symptoms.  MAKE SURE YOU:    Understand these instructions.   Will watch your condition.    Will get help right away if you are not doing well or get worse.  Document Released: 08/14/2005 Document Re-Released: 12/31/2008 [facility] Patient Information 2011 [place].

## 2011-06-07 LAB — CBC
MCHC: 34.3
MCV: 97
RBC: 3.19 — ABNORMAL LOW
RDW: 13.2
WBC: 7.8

## 2011-06-07 LAB — BASIC METABOLIC PANEL
BUN: 10
Creatinine, Ser: 1.68 — ABNORMAL HIGH
GFR calc Af Amer: 51 — ABNORMAL LOW
GFR calc non Af Amer: 42 — ABNORMAL LOW
Potassium: 3.9

## 2011-06-08 LAB — BASIC METABOLIC PANEL
BUN: 14
BUN: 23
CO2: 27
Calcium: 8.3 — ABNORMAL LOW
Calcium: 9.6
Chloride: 101
Creatinine, Ser: 1.24
Creatinine, Ser: 1.37
GFR calc non Af Amer: 60 — ABNORMAL LOW
Glucose, Bld: 140 — ABNORMAL HIGH
Glucose, Bld: 85
Sodium: 137

## 2011-06-08 LAB — PSA: PSA: 9.22 — ABNORMAL HIGH

## 2011-06-08 LAB — TYPE AND SCREEN
ABO/RH(D): B POS
Antibody Screen: NEGATIVE

## 2011-06-08 LAB — CBC
Hemoglobin: 13.6
MCHC: 34.2
MCHC: 34.5
MCV: 96.7
Platelets: 202
RDW: 12.3
RDW: 13.2

## 2011-06-08 LAB — DIFFERENTIAL
Basophils Absolute: 0
Basophils Relative: 0
Eosinophils Absolute: 0
Monocytes Relative: 7
Neutro Abs: 7.9 — ABNORMAL HIGH
Neutrophils Relative %: 85 — ABNORMAL HIGH

## 2011-06-22 ENCOUNTER — Ambulatory Visit: Payer: Self-pay | Admitting: Sports Medicine

## 2011-06-25 ENCOUNTER — Encounter: Payer: Self-pay | Admitting: Sports Medicine

## 2011-06-25 NOTE — Progress Notes (Signed)
Subjective: [name] is a 65 y.o. male presenting today for evaluation of reflux like symptoms that have been occuring persistently over the last 6 months.  He states that it seems like food have been coming back up after eating and especially at night.  This gives him a sensation of having a lung in his throat that goes all the way down behind his chest.  He states that drinking water will "wash down" the reflux and that it will give him relief for a couple of hours. No food associations.  Worse at night.  No OTC meds; No prior episodes similar to this.   ROS: He does occasionally spit up food but no overt vomiting.  Positive Cough. No chest pain, no shortness of breath. No nausea with this and no overt vomiting No recent illnesses   PE: GENERAL:  Middle Aged male, examined in [facility].  Alert and appropriate.  In no discomfort; no respiratory distress. HNEENT: PERRLA, extra ocular movement intact, oropharynx clear, no lesions and neck supple with midline trachea THORAX: HEART: S1, S2 normal, no murmur, rub or gallop, regular rate and rhythm LUNGS: clear to auscultation, no wheezes or rales and unlabored breathing  Extremities:  No edema, warm well perfused, distal pulses intact

## 2011-06-25 NOTE — Assessment & Plan Note (Signed)
Will trial PPI for 2-3 months to tx for long standing reflux.  If not improved will consider referral to GI for upper endoscopy

## 2011-07-11 ENCOUNTER — Encounter: Payer: Self-pay | Admitting: Sports Medicine

## 2011-07-11 ENCOUNTER — Ambulatory Visit (INDEPENDENT_AMBULATORY_CARE_PROVIDER_SITE_OTHER): Payer: Self-pay | Admitting: Sports Medicine

## 2011-07-11 VITALS — BP 141/80 | HR 97 | Temp 99.0°F | Ht 72.0 in | Wt 278.0 lb

## 2011-07-11 DIAGNOSIS — K219 Gastro-esophageal reflux disease without esophagitis: Secondary | ICD-10-CM

## 2011-07-11 DIAGNOSIS — R030 Elevated blood-pressure reading, without diagnosis of hypertension: Secondary | ICD-10-CM

## 2011-07-11 DIAGNOSIS — IMO0001 Reserved for inherently not codable concepts without codable children: Secondary | ICD-10-CM

## 2011-07-11 MED ORDER — OMEPRAZOLE 40 MG PO CPDR: 40.0000 mg | DELAYED_RELEASE_CAPSULE | Freq: Two times a day (BID) | ORAL | Status: DC

## 2011-07-11 NOTE — Progress Notes (Signed)
Memory Subjective:  NUH MEEDS is a 65 y.o. male presenting today for follow up of his reflux like symptoms.  He reports doing well his reflux like symptoms have really diminished since beginning PPI. He also notices that dietary changes have made a drastic difference in his eating things and much less spicy and much less salty.  He is having much less reflux like symptoms at night. Doesn't have some relief in the middle of the night after drinking water.   ROS: Denies chest pain shortness of breath. Denies urinary changes including frequency hesitancy hematuria. No constipation or diarrhea. No nausea or vomiting. Reflux symptoms as above. Reports being followed closely by urology for his prostate cancer with a reassuring PSA recently. Does report some shakiness of his hands and increasing difficulty with short-term memory.   PE: GENERAL:  Adult African American male came in [facility]. Alert appropriately interactive no discomfort no respiratory distress. HNEENT:  Atraumatic normocephalic, no scleral icterus, moist mucous membranes THORAX: HEART: S1, S2 normal, no murmur, rub or gallop, regular rate and rhythm LUNGS: clear to auscultation, no wheezes or rales and unlabored breathing ABDOMEN:  abdomen is soft without significant tenderness, masses, organomegaly or guarding EXTREMITIES: Warm well-perfused no peripheral edema

## 2011-07-11 NOTE — Patient Instructions (Signed)
Is good to see you again today. I'm glad to hear that you some of your symptoms have gotten better.  I like to continue you on medications for the next 2 months to help with her reflux. It is most important however that you've had the dietary changes we have already talked about.  Try sweating at least twice a week for at least 10 minutes.  I will plan on seeing you back in 2 months to follow up how you're reflux symptoms are. We'll also need to check your blood pressure once again and if we need to at that time we can discuss initiating a medication to help.

## 2011-07-31 ENCOUNTER — Telehealth: Payer: Self-pay | Admitting: Sports Medicine

## 2011-07-31 DIAGNOSIS — Z1211 Encounter for screening for malignant neoplasm of colon: Secondary | ICD-10-CM

## 2011-07-31 NOTE — Telephone Encounter (Signed)
Pt has orange card and is needing to get a colonoscopy - wants to know if he can be referred

## 2011-08-01 NOTE — Telephone Encounter (Signed)
[name] to put in referral for colonoscopy. Patient has orange card so I cannot guarantee he will be able to get one. -----Clarise Cruz

## 2011-08-01 NOTE — Telephone Encounter (Signed)
Addended by: [name] D on: 08/01/2011 11:51 AM   Modules accepted: Orders

## 2011-08-03 ENCOUNTER — Encounter: Payer: Self-pay | Admitting: Sports Medicine

## 2011-08-03 DIAGNOSIS — I10 Essential (primary) hypertension: Secondary | ICD-10-CM | POA: Insufficient documentation

## 2011-08-03 NOTE — Assessment & Plan Note (Signed)
Will continue to assess his blood pressures on subsequent visits.  He has decreased his salt intake recently and we discussed further dietary changes as well as increasing his activity.

## 2011-08-03 NOTE — Assessment & Plan Note (Signed)
We'll continue his PPI for an additional 2 months as he is reporting improvement.[name] plan on seeing him back at that point.  He is to continue dietary modifications.

## 2011-09-11 ENCOUNTER — Encounter: Payer: Self-pay | Admitting: Sports Medicine

## 2011-09-11 ENCOUNTER — Ambulatory Visit (INDEPENDENT_AMBULATORY_CARE_PROVIDER_SITE_OTHER): Payer: Self-pay | Admitting: Sports Medicine

## 2011-09-11 VITALS — BP 138/86 | HR 91 | Temp 98.7°F | Ht 72.0 in | Wt 272.8 lb

## 2011-09-11 DIAGNOSIS — Q602 Renal agenesis, unspecified: Secondary | ICD-10-CM

## 2011-09-11 DIAGNOSIS — Z0000 Encounter for general adult medical examination without abnormal findings: Secondary | ICD-10-CM

## 2011-09-11 DIAGNOSIS — K219 Gastro-esophageal reflux disease without esophagitis: Secondary | ICD-10-CM

## 2011-09-11 DIAGNOSIS — IMO0001 Reserved for inherently not codable concepts without codable children: Secondary | ICD-10-CM

## 2011-09-11 DIAGNOSIS — Z905 Acquired absence of kidney: Secondary | ICD-10-CM

## 2011-09-11 DIAGNOSIS — R030 Elevated blood-pressure reading, without diagnosis of hypertension: Secondary | ICD-10-CM

## 2011-09-11 MED ORDER — OMEPRAZOLE 40 MG PO CPDR: 40.0000 mg | DELAYED_RELEASE_CAPSULE | Freq: Two times a day (BID) | ORAL | Status: DC

## 2011-09-11 NOTE — Patient Instructions (Addendum)
It was great to see you today. You're doing a great job with your weight loss and in spite of the holidays and continued to head in the right direction.  Let's stop your tumeric and continue with 40 mg in the morning and 40 mg at night of your omeprazole. I will try to get a referral to GI set up for you however may have difficulties do to insurance. We'll be in touch [name] we can get worked out.  I think you should see some benefit by stopping the tumeric but continue to encourage you to make dietary changes as well as weight loss.  Please reticular medications as prescribed.  We will make sure we follow your blood pressure at our next office visit.  I like to see you back in 2-3 months and would like to do some blood work on you at that time.  If you need [place] before then please feel free to call make an appointment and we will be in touch regarding referral to gastroenterology.  Please make sure to bring all of your medications with you at your next visit including any supplements whether those be pill form or powder.

## 2011-09-15 ENCOUNTER — Encounter: Payer: Self-pay | Admitting: Sports Medicine

## 2011-09-15 DIAGNOSIS — Z0000 Encounter for general adult medical examination without abnormal findings: Secondary | ICD-10-CM | POA: Insufficient documentation

## 2011-09-15 NOTE — Progress Notes (Signed)
Patient ID: [name], male   DOB: [birth year], 66 y.o.   MRN: [id number] Subjective:  [name] is a 66 y.o. male presenting today for follow-up of his reflux symtoms that are described as an occasional globus sensation - not present today.    His symptoms have improved slightly since initiating PPI therapy but he has noticed that the majority of his relief comes from dietary modification.  Plain meals without spicy foods tend to relieve his symptoms the most.  He does report excessive use of tumeric on a daily basis because he always feels like more of things is better but questions if this may be contributing to his continued symptoms.     Constitutional No decreased activity,  Intentional weight loss although he was somewhat surprised he was able to loose weight over the holidays.  No night sweats.  Infectious No recent illnesses, no recent sick contacts.   Resp No dyspnea or cough  Cardiac No palpitations  GI No changes in urine  GU No changes in bowel habits, no melana, no BRBPR, Globus sensation and heart burn as above  Trauma None reported  Nutrition Bland diet as above  Activity Slowly increasing activity and attempting to walk on a more regular basis.  ROS as per HPI and above otherwise 12 point ROS negative.   PE: GENERAL: AA male, examined in [place]. Alert and appropriate. In no discomfort; no respiratory distress.  HNEENT: PERRLA, extra ocular movement intact, oropharynx clear, no lesions and neck supple with midline trachea, no masses appreciated  THORAX:  HEART: S1, S2 normal, no murmur, rub or gallop, regular rate and rhythm  LUNGS: clear to auscultation, no wheezes or rales and unlabored breathing  Extremities: No edema, warm well perfused, distal pulses intact

## 2011-09-15 NOTE — Assessment & Plan Note (Signed)
Continues to be slightly elevated.  On recheck today was 138/86.  Will consider addition of HCTZ if greater than 140/90.  Continue low salt diet and wt loss

## 2011-09-15 NOTE — Assessment & Plan Note (Addendum)
In dicussing his PPI discovered he has been using excessive Tumeric as a supplement.  Tumeric reported as blocking absorption of PPIs and ? Efficacy of past 2 months of treatment.  Will continue PPI and stop tumeric for now.  Concern raised for malignant process especially in setting of weight loss although reported to be intentional.  Pt is due for colonoscopy and would likely benefit from EGD.  Will try for referall to GI again in setting of no insurance.  Past hx per patient report of Renal Cell Carcinoma increases concern higher.    Will continue with symptomatic treatment at this time and provide referral to GI.

## 2011-09-15 NOTE — Assessment & Plan Note (Addendum)
Will check TSH, A1C, CMET and consider ESR on next visit.

## 2011-09-16 NOTE — Progress Notes (Signed)
Addended by: [name] D on: 09/16/2011 01:02 PM   Modules accepted: Orders

## 2011-09-19 ENCOUNTER — Ambulatory Visit (INDEPENDENT_AMBULATORY_CARE_PROVIDER_SITE_OTHER): Payer: Self-pay | Admitting: Sports Medicine

## 2011-09-19 ENCOUNTER — Encounter: Payer: Self-pay | Admitting: Sports Medicine

## 2011-09-19 DIAGNOSIS — I10 Essential (primary) hypertension: Secondary | ICD-10-CM

## 2011-09-19 DIAGNOSIS — Z760 Encounter for issue of repeat prescription: Secondary | ICD-10-CM

## 2011-09-19 DIAGNOSIS — K219 Gastro-esophageal reflux disease without esophagitis: Secondary | ICD-10-CM

## 2011-09-19 DIAGNOSIS — R634 Abnormal weight loss: Secondary | ICD-10-CM

## 2011-09-19 DIAGNOSIS — Z79899 Other long term (current) drug therapy: Secondary | ICD-10-CM

## 2011-09-19 MED ORDER — HYDROCHLOROTHIAZIDE 25 MG PO TABS: 25.0000 mg | ORAL_TABLET | Freq: Every day | ORAL | Status: DC

## 2011-09-19 NOTE — Assessment & Plan Note (Signed)
There still concern as patient does not have gastroesophageal reflux and potentially a mass especially in light of globus sensation. We will attempt referral to local gastroenterologist as well as [facility].

## 2011-09-19 NOTE — Assessment & Plan Note (Signed)
Patient reporting intentional weight loss as he is paying closer attention to his diet. However he has had fairly excessive weight loss including 2.6 pounds in less 8 days.  Concern raised for metastatic process especially in setting of prior renal cell carcinoma, prostate cancer and upper GI symptomatology. Will attempt referral to local gastroenterologist as well as [place] for consideration of diagnositic EGD and colonoscopy.

## 2011-09-19 NOTE — Assessment & Plan Note (Signed)
Started chlorothiazide today will followup at next visit. We'll obtain a CMET at next visit

## 2011-09-19 NOTE — Patient Instructions (Signed)
It was great to see you again today.  Lets cut back on your herbal supplements and stick with the Plant Sterols, Vitamin D and Renal Multivitamin.  I will look into the other meds and see if there are any we will be able to start back.  We have put in a referral request to the GI doctors and will let you know when we hear something  Please start the medication Hydrochlorthiazide because your BP has been elevated the last couple of visits.  We will recheck it at next visit.

## 2011-09-19 NOTE — Progress Notes (Signed)
Patient ID: [name], male   DOB: 1945-09-18, 67 y.o.   MRN: [id number] Subjective:  [name] is a 66 y.o. male presenting today for for further evaluation of his herbal medication regimen as well as followup for his gastroesophageal reflux.  He did bring with him his extensive herbal medications and these were reviewed as above. Does report being vigilant with his diet currently. He does have a persistent globus sensation that is not relieved with omeprazole.    ROS  Constitutional  no decreased energy, no night sweats , weight loss report is intentional - greater than 2 pounds in the last one week   Infectious  no fevers, no chills   Resp  no cough no congestion   Cardiac  the chest abdomen or palpitations   GI  globus sensation as above, occasional nausea, no vomiting, no change in bowel habits   MEDS  open to changes in his regimen, does report that he is the type of person who if one is good, two is better and admits to excess regarding his medications    PE: GENERAL:  African American male examined in [facility]. Alert and appropriately interactive, in no discomfort, no respiratory distress HNEENT: AT/[place], MMM, no scleral icterus, EOMi THORAX: HEART: RRR, S1/S2 heard, no murmur LUNGS: CTA B, no wheezes, no crackles ABDOMEN:  +BS, soft, non-tender, no rigidity, no guarding, no masses/organomegaly EXTREMITIES: Moves all 4 extremities spontaneously, warm well perfused,

## 2011-09-19 NOTE — Assessment & Plan Note (Addendum)
Will decrease his herbal regimen as concern for interaction with his PPI as well as concern for a potential etiology of his GI discomfort although I feel this is less likely.  We'll continue him only on his meds plant sterols, vitamin D, and renal multivitamin.

## 2011-10-01 ENCOUNTER — Other Ambulatory Visit: Payer: Self-pay | Admitting: Sports Medicine

## 2011-10-01 NOTE — Telephone Encounter (Signed)
Refill request

## 2011-10-05 ENCOUNTER — Encounter: Payer: Self-pay | Admitting: Sports Medicine

## 2011-10-09 ENCOUNTER — Telehealth: Payer: Self-pay | Admitting: Sports Medicine

## 2011-10-09 NOTE — Telephone Encounter (Signed)
Patient is calling to find out what needs to be done next for the procedure for Acid Reflux.  Does he need to make an appt to be seen or what?

## 2011-10-13 NOTE — Telephone Encounter (Signed)
Called back and message left to return call.

## 2011-10-16 NOTE — Telephone Encounter (Signed)
Called again and left a message to call back.

## 2011-10-17 NOTE — Telephone Encounter (Signed)
Patient calls back . Continuing to have much problem with acid reflux. Consulted with Enid Skeens CMA and she states they have sent referral to [facility]  and no appointment available. Now they are trying to get him an appointment with [facility]. He will probably need $200.00 up front for this appointment.  Appointment scheduled for 02/22 with Dr.Rigby to discuss other options .

## 2011-10-20 ENCOUNTER — Encounter: Payer: Self-pay | Admitting: Sports Medicine

## 2011-10-20 ENCOUNTER — Ambulatory Visit (INDEPENDENT_AMBULATORY_CARE_PROVIDER_SITE_OTHER): Payer: Self-pay | Admitting: Sports Medicine

## 2011-10-20 VITALS — BP 160/92 | HR 87 | Temp 98.2°F | Ht 72.0 in | Wt 265.2 lb

## 2011-10-20 DIAGNOSIS — A048 Other specified bacterial intestinal infections: Secondary | ICD-10-CM | POA: Insufficient documentation

## 2011-10-20 DIAGNOSIS — K219 Gastro-esophageal reflux disease without esophagitis: Secondary | ICD-10-CM

## 2011-10-20 DIAGNOSIS — I10 Essential (primary) hypertension: Secondary | ICD-10-CM

## 2011-10-20 MED ORDER — BISMUTH SUBSALICYLATE 262 MG PO TABS: 2.0000 | ORAL_TABLET | Freq: Four times a day (QID) | ORAL | Status: DC

## 2011-10-20 MED ORDER — HYDROCHLOROTHIAZIDE 25 MG PO TABS: 12.5000 mg | ORAL_TABLET | Freq: Every day | ORAL | Status: DC

## 2011-10-20 MED ORDER — METRONIDAZOLE 500 MG PO TABS: 500.0000 mg | ORAL_TABLET | Freq: Three times a day (TID) | ORAL | Status: AC

## 2011-10-20 MED ORDER — CLARITHROMYCIN 500 MG PO TABS: 500.0000 mg | ORAL_TABLET | Freq: Two times a day (BID) | ORAL | Status: AC

## 2011-10-20 MED ORDER — AMLODIPINE BESYLATE 5 MG PO TABS: 5.0000 mg | ORAL_TABLET | Freq: Every day | ORAL | Status: DC

## 2011-10-20 MED ORDER — AMOXICILLIN 500 MG PO TABS: 1000.0000 mg | ORAL_TABLET | Freq: Two times a day (BID) | ORAL | Status: AC

## 2011-10-20 MED ORDER — OMEPRAZOLE 20 MG PO CPDR: 20.0000 mg | DELAYED_RELEASE_CAPSULE | Freq: Two times a day (BID) | ORAL | Status: DC

## 2011-10-20 NOTE — Patient Instructions (Addendum)
It was great to see you today.  For your reflux we are testing you for an infection called H. Pylori and sending you to have a special X-ray of your throat.  I would like to see you at the end of next week to go over these results with you.  For your blood pressure i would like to continue you on the hydrochlorothiazide (1/2 dose) but would like to start a new medication called amlodipine.    Lets plan on seeing each other next week to check on how things are going

## 2011-10-20 NOTE — Assessment & Plan Note (Signed)
Will tx with prev pac

## 2011-10-20 NOTE — Assessment & Plan Note (Addendum)
Will add CCB Will continue HCTZ even though only minimal effect

## 2011-10-26 ENCOUNTER — Telehealth: Payer: Self-pay | Admitting: Sports Medicine

## 2011-10-26 NOTE — Telephone Encounter (Signed)
[name] calling back to touch base with [place] regarding a test for his acid reflux.  Said an appt was to be made within [facility] system.  Pt has [employer] card.  Please call him back with info.

## 2011-10-26 NOTE — Assessment & Plan Note (Addendum)
Will refer for upper GI barium study to evaluate for any mass effects. Also test for H. pylori at this time.  Would still prefer for patient to undergo EGD however unable due to financial reasons.

## 2011-10-26 NOTE — Progress Notes (Signed)
Subjective:  [name] is a 66 y.o. male presenting today for follow up regarding his GERD like symptoms.  He reports no change in character and still only a persistent globus-like sensation. Denies any pain or burning like sensation. Does associate relief with drinking fluids. Unable to schedule for EGD at this time due to financial reasons.   Followup on blood pressure. Has been compliant with his medications and his medications were reviewed as indicated in medication reconciliation. Denies any chest pain, palpitations, or other cardiac symptoms, no swelling.   ROS Per history of present illness  Past Medical Hx Reviewed: yes Medications Reviewed: yes Family History Reviewed: yes   PE: GENERAL:  African American male examined in [place]. Alert appropriate interactive. NAD, no respiratory distress HNEENT: AT/[place], MMM, no scleral icterus, EOMi THORAX: HEART: RRR, S1/S2 heard, no murmur LUNGS: CTA B, no wheezes, no crackles

## 2011-11-03 ENCOUNTER — Telehealth: Payer: Self-pay | Admitting: Sports Medicine

## 2011-11-03 NOTE — Telephone Encounter (Signed)
Patient called back on 2/28 about the test that Dr. Paulla Fore told him he was going to order for Acid Reflux.  He hasn't heard anything about that appt yet.

## 2011-11-13 ENCOUNTER — Telehealth: Payer: Self-pay | Admitting: Sports Medicine

## 2011-11-13 NOTE — Telephone Encounter (Signed)
Pt is wanting to know about the tests that he is supposed to be scheduled for.  Also has a question about Metronadizole

## 2011-11-13 NOTE — Telephone Encounter (Signed)
Has this been addressed?

## 2011-11-13 NOTE — Telephone Encounter (Signed)
The upper GI study with barium has been ordered and is pending.  Please have him go to the radiology department to have this test completed.    He isn't on flagyl so I'm not sure what his question is regarding.  Please let me know if there is anything I can discuss with him.  Otherwise I will plan to contact him regarding the result of the X-Ray

## 2011-11-14 NOTE — Telephone Encounter (Signed)
Called patient and informed him of GI [place] set up. 4/9 @ 10am patient to arrive @ 9:45am at [facility]. NPO after midnight.   Patient had numerous question about medications that I was able to answer for him.  [name], The question about metronidazole is that he was having black bowel movements when taking this and was wanting to know if he should still continue the rest of it.

## 2011-11-14 NOTE — Progress Notes (Signed)
Addended by: Johny Shears on: 11/14/2011 09:41 AM   Modules accepted: Orders

## 2011-11-14 NOTE — Telephone Encounter (Signed)
See phone note from 3/8. Has all information.[name]

## 2011-11-23 ENCOUNTER — Telehealth: Payer: Self-pay | Admitting: Sports Medicine

## 2011-11-23 NOTE — Telephone Encounter (Signed)
Patient will be having a test for Acid Reflux on 4/9 and he was hoping that Dr. Paulla Fore could add orders for a test for his abdominal discomfort.

## 2011-11-24 NOTE — Telephone Encounter (Signed)
Called and spoke with pt.  He reports vague pain around his navel area - constant low level pain. Worse immediately prior to having to use the rest room.  No interference with activity.  Also reports no more black stools now that he stopped taking pepto-bismol for his H.Pylori treatment.  Discussed following up with him after his esophagus study and scheduled appointment for him on April 11 at 200PM.  Informed him to call and change his appointment if he is unable to make it.    We will plan on Hemoccult cards for him to go home with.

## 2011-12-05 ENCOUNTER — Other Ambulatory Visit (HOSPITAL_COMMUNITY): Payer: Self-pay

## 2011-12-05 ENCOUNTER — Other Ambulatory Visit: Payer: Self-pay | Admitting: Sports Medicine

## 2011-12-05 ENCOUNTER — Ambulatory Visit (HOSPITAL_COMMUNITY)
Admission: RE | Admit: 2011-12-05 | Discharge: 2011-12-05 | Disposition: A | Discharge status: Home / Self Care | Payer: Self-pay | Source: Ambulatory Visit | Attending: Family Medicine | Admitting: Family Medicine

## 2011-12-05 DIAGNOSIS — K219 Gastro-esophageal reflux disease without esophagitis: Secondary | ICD-10-CM

## 2011-12-07 ENCOUNTER — Encounter: Payer: Self-pay | Admitting: Sports Medicine

## 2011-12-07 ENCOUNTER — Ambulatory Visit (INDEPENDENT_AMBULATORY_CARE_PROVIDER_SITE_OTHER): Payer: Self-pay | Admitting: Sports Medicine

## 2011-12-07 VITALS — BP 141/75 | HR 83 | Temp 98.0°F | Ht 72.0 in | Wt 258.1 lb

## 2011-12-07 DIAGNOSIS — Z8546 Personal history of malignant neoplasm of prostate: Secondary | ICD-10-CM

## 2011-12-07 DIAGNOSIS — R634 Abnormal weight loss: Secondary | ICD-10-CM

## 2011-12-07 DIAGNOSIS — E049 Nontoxic goiter, unspecified: Secondary | ICD-10-CM

## 2011-12-07 DIAGNOSIS — R1084 Generalized abdominal pain: Secondary | ICD-10-CM

## 2011-12-07 DIAGNOSIS — Z1211 Encounter for screening for malignant neoplasm of colon: Secondary | ICD-10-CM | POA: Insufficient documentation

## 2011-12-07 DIAGNOSIS — Z0000 Encounter for general adult medical examination without abnormal findings: Secondary | ICD-10-CM

## 2011-12-07 DIAGNOSIS — I10 Essential (primary) hypertension: Secondary | ICD-10-CM

## 2011-12-07 DIAGNOSIS — K219 Gastro-esophageal reflux disease without esophagitis: Secondary | ICD-10-CM

## 2011-12-07 DIAGNOSIS — E010 Iodine-deficiency related diffuse (endemic) goiter: Secondary | ICD-10-CM

## 2011-12-07 LAB — CBC
MCV: 92.9 fL (ref 78.0–100.0)
Platelets: 208 10*3/uL (ref 150–400)
RBC: 4.22 MIL/uL (ref 4.22–5.81)
WBC: 5.6 10*3/uL (ref 4.0–10.5)

## 2011-12-07 LAB — COMPREHENSIVE METABOLIC PANEL
AST: 28 U/L (ref 0–37)
Albumin: 4.3 g/dL (ref 3.5–5.2)
Alkaline Phosphatase: 56 U/L (ref 39–117)
BUN: 23 mg/dL (ref 6–23)
Potassium: 4.7 mEq/L (ref 3.5–5.3)

## 2011-12-07 LAB — LDL CHOLESTEROL, DIRECT: Direct LDL: 224 mg/dL — ABNORMAL HIGH

## 2011-12-07 NOTE — Assessment & Plan Note (Signed)
Stable/Well Controlled - no changes at this time. 

## 2011-12-07 NOTE — Assessment & Plan Note (Signed)
Will continue omeprazole twice a day.  Would prefer trying to switch to something such as Dexilant however due to financial considerations unable to time.

## 2011-12-07 NOTE — Assessment & Plan Note (Signed)
Patient is due for a return visit to urology however is unable to afford.  Will obtain PSA at next office visit with other labs.

## 2011-12-07 NOTE — Assessment & Plan Note (Signed)
Continues to improve.  Has made positive lifestyle changes including diet.

## 2011-12-07 NOTE — Patient Instructions (Addendum)
It was nice to see you today.  I am glad that your reflux is interfering less in your life.   Today we discussed:   Please refill your omeprazole and continue taking this twice a day.  Your blood pressure is better controlled today.  Try to increase the potassium rich foods that we talked about and try to find a salt substitute for flavoring.   Consider returning to your urologist for check up with them.   I would like for you to perform 3 Hemoccult cards as per the instructions provided.  This is an additional screening test for colon cancer that we can perform yearly until we are able to colonoscopy.  Routine laboratories on you today including sugar, cholesterol, thyroid function, prostate.  We will let you know about any abnormalities otherwise we'll discuss these at you next visit.  Please plan to return to see me in 6-8 weeks.  If you need anything prior to seeing me please call the clinic.

## 2011-12-07 NOTE — Assessment & Plan Note (Signed)
Would prefer patient to have colonoscopy as previously as a multiple cancers and polyp removed.  The patient unable to afford. We'll perform serial Hemoccults.

## 2011-12-08 LAB — SEDIMENTATION RATE: Sed Rate: 9 mm/hr (ref 0–16)

## 2011-12-08 LAB — PSA: PSA: 113 ng/mL — ABNORMAL HIGH (ref <=4.00)

## 2011-12-13 ENCOUNTER — Telehealth: Payer: Self-pay | Admitting: Sports Medicine

## 2011-12-13 NOTE — Telephone Encounter (Signed)
Mr. Eads has acquired the $200.00 to take to [facility] for the appt he was trying to have back in February.  Need for provider to make contact with [facility] again to see if a new  appt can be made.  Please let him know all details when completed.  Mr. Levins also want to inquire if the visit he will be having with [facility] will be for the same type of test he had here or will there be something different.  Do not want to have a repetition of the same if that is the case.

## 2011-12-14 NOTE — Telephone Encounter (Signed)
This would be for a different test.  It would be for an Endoscopy (EGD and potentially a colonoscopy).  I still advise that he have both of these tests.  Please call and inform him of this

## 2011-12-18 LAB — POC HEMOCCULT BLD/STL (HOME/3-CARD/SCREEN): Card #3 Fecal Occult Blood, POC: NEGATIVE

## 2011-12-20 ENCOUNTER — Telehealth: Payer: Self-pay | Admitting: Sports Medicine

## 2011-12-20 NOTE — Telephone Encounter (Signed)
Patients phone was not working for a while so he didn't know if he had missed a call about the appointment with Gastroenterologist.  He wants to move forward with making that appointment.

## 2011-12-21 NOTE — Telephone Encounter (Signed)
Attempted to call patient, but his phone has a mailbox that is not set up and I cannot leave a message. Seems like the questions are pertaining to previous phone note, but we were unable to reach the patient do to phone not working.  THIS IS THE PHONE NOTE FROM 12/13/2011  [name], DO 12/14/2011 8:05 PM Signed  This would be for a different test. It would be for an Endoscopy (EGD and potentially a colonoscopy). I still advise that he have both of these tests.  Please call and inform him of this [name] 12/13/2011 12:21 PM Signed  [name] has acquired the $200.00 to take to [facility] for the appt he was trying to have back in February. Need for provider to make contact with [facility] again to see if a new appt can be made. Please let him know all details when completed. [name] also want to inquire if the visit he will be having with [facility] will be for the same type of test he had here or will there be something different. Do not want to have a repetition of the same if that is the case.

## 2011-12-22 NOTE — Progress Notes (Signed)
Visit is in andHPI:  [name] is a 65 y.o. male presenting today for follow-up of  Reflux. Reports having less symptoms especially with diet control.  Does report continued weight loss however is intentional this time.  Denies any hematochezia or melena.  Continues with his extensive dietary changes however is consuming excessive salt low potassium rich foods. Denies any globus sensation at this time.  Denies any nausea or vomiting,   is report his PSA has not been checked in quite some time; has been unable to followup with his urologist due to financial constraints.    ROS  per history of present illness   HISTORY Medications Reviewed & Updated, see associated section Medical Hx Reviewed: Yes  Family History Reviewed: Yes  Social History Reviewed:  Yes   PE: GENERAL:   adult African American male.  Examined in [facility] .   sitting comfortably in exam chair   In no discomfort; norespiratory distress.   PSYCH: Alert and appropriately interactive; Insight:Good   H&N: AT/[place], MMM, no scleral icterus, EOMi THORAX: HEART: RRR, S1/S2 heard, no murmur LUNGS: CTA B, no wheezes, no crackles ABDOMEN:  +BS, soft, non-tender, no rigidity, no guarding, no masses/organomegaly EXTREMITIES: Moves all 4 extremities spontaneously, warm well perfused, no edema, bilateral DP and PT pulses 2+/4.

## 2011-12-26 ENCOUNTER — Encounter: Payer: Self-pay | Admitting: Sports Medicine

## 2011-12-29 NOTE — Telephone Encounter (Signed)
refaxed to [facility]

## 2012-01-03 ENCOUNTER — Telehealth: Payer: Self-pay | Admitting: Sports Medicine

## 2012-01-03 NOTE — Telephone Encounter (Signed)
Patient is checking on the Referral for Gastroenterology.  I let him know that Clarise Cruz would call him back.

## 2012-01-03 NOTE — Telephone Encounter (Signed)
Attempted to call patient to inform him of the GI appointment he is requesting. Again twice I called and got the message that the mailbox is not set up "again". Appointment set with [place] [facility] for Monday 03/18/2012 @ 1 pm. It is going to be at the hospital [address]. Phone number there is [phone]. A new patient packet is being mailed to the patient. Due to patient having [employer] I was informed that the co-pay he would have would be one that was set up for him. If when he applied for the card and was approved he should have either been told no pay/ or given an amount. Patient to check on card he has. Any financial questions he can call katherine @ [phone]

## 2012-01-04 NOTE — Telephone Encounter (Signed)
"  ONCE AGAIN CALLED AND MAILBOX NOT SET UP" I have attempted to call patient numerous times and he will call back at his convince and is very agitated when he does. I have been trying and trying for this patient and am getting nowhere because we are unable to communicate.

## 2012-02-05 ENCOUNTER — Ambulatory Visit: Payer: Self-pay | Admitting: Family Medicine

## 2012-03-18 DIAGNOSIS — R0989 Other specified symptoms and signs involving the circulatory and respiratory systems: Secondary | ICD-10-CM | POA: Insufficient documentation

## 2012-04-17 HISTORY — PX: COLONOSCOPY: SHX174

## 2012-07-05 ENCOUNTER — Telehealth: Payer: Self-pay | Admitting: Sports Medicine

## 2012-07-05 NOTE — Telephone Encounter (Signed)
Patient states Tuesday he  was moving 500 lbs of mulch and when he got halfway through he realized that the pile had mold in it . Tuesday night he started coughing up brown sputum.  Since then his coughing has decreased and the sputum is not as brown. Having no shortness of breath or fever. Consulted with Dr. Nori Riis and she advises to just watch  for symptoms now. If he should get short of breath will need to be seen. Should gradually improve.    He has contacted [facility] but they haven't called him back yet . He is wondering what to do about the mulch pile. Gave him phone number to  [facility] to contact [place] [facility] to ask them about.

## 2012-07-05 NOTE — Telephone Encounter (Signed)
Was grinding trees into mulch on Tuesday and some of it had some mold that he inhaled - started coughing and sneezing and still feels like he has a cold - wants to know if he needs to be seen.

## 2012-09-02 ENCOUNTER — Other Ambulatory Visit: Payer: Self-pay | Admitting: *Deleted

## 2012-09-02 ENCOUNTER — Ambulatory Visit (INDEPENDENT_AMBULATORY_CARE_PROVIDER_SITE_OTHER): Payer: No Typology Code available for payment source | Admitting: *Deleted

## 2012-09-02 VITALS — BP 140/90 | HR 84

## 2012-09-02 DIAGNOSIS — I10 Essential (primary) hypertension: Secondary | ICD-10-CM

## 2012-09-02 MED ORDER — AMLODIPINE BESYLATE 5 MG PO TABS: 5.0000 mg | ORAL_TABLET | Freq: Every day | ORAL | Status: DC

## 2012-09-02 MED ORDER — HYDROCHLOROTHIAZIDE 25 MG PO TABS: 12.5000 mg | ORAL_TABLET | Freq: Every day | ORAL | Status: DC

## 2012-09-02 NOTE — Progress Notes (Signed)
Patient in today to renew [facility] and ask to have BP checked. Last office visit April 2013.  States he has not taken BP med since July. He thinks he ran out and never had refilled. He did have refills on both meds, HCTZ and Amlodipine.    BP 140/90 bilaterally . Checked manually using large adult cuff.  Pulse 84. Consulted with Dr. Tamala Julian, preceptor today ,and  he advises to send in 2 weeks worth of meds  and schedule  appointment with PCP. Appointment  scheduled for  09/10/2012. RX for amlopidine and HCTZ called to [place].

## 2012-09-10 ENCOUNTER — Ambulatory Visit: Payer: No Typology Code available for payment source | Admitting: Sports Medicine

## 2012-12-20 HISTORY — PX: COLONOSCOPY: SHX174

## 2013-02-17 ENCOUNTER — Telehealth: Payer: Self-pay | Admitting: Sports Medicine

## 2013-02-17 NOTE — Telephone Encounter (Signed)
Patient wants to speak to Dr. Paulla Fore about Yellow dye and Blue dye in food and if they are good or bad for you.

## 2013-02-17 NOTE — Telephone Encounter (Signed)
Attempted to return pt's phone call, no message able to leave.  [name], Loralyn Freshwater, [place]

## 2013-02-18 ENCOUNTER — Telehealth: Payer: Self-pay | Admitting: Sports Medicine

## 2013-02-18 NOTE — Telephone Encounter (Signed)
Error

## 2013-02-18 NOTE — Telephone Encounter (Signed)
Patient called yesterday 6/23 wanting information on the blue dye and yellow dye in food products. He wants to know if it is safe to eat these products.JW

## 2013-02-18 NOTE — Telephone Encounter (Signed)
Pt hasn't returned to care in >1 year. Please have him schedule an appointment, I can discuss these things at followup

## 2013-02-18 NOTE — Telephone Encounter (Signed)
Tried to call pt again.  Please give pt note from MD below, if patient calls back again.  thank you.  Jameka Ivie, Loralyn Freshwater, [place]

## 2013-02-18 NOTE — Telephone Encounter (Signed)
LM on answering machine that i was returning patient's call.  I did this yesterday as well.  Normagene Harvie, Loralyn Freshwater, [place]

## 2013-02-26 ENCOUNTER — Encounter: Payer: Self-pay | Admitting: Sports Medicine

## 2013-02-26 ENCOUNTER — Ambulatory Visit (INDEPENDENT_AMBULATORY_CARE_PROVIDER_SITE_OTHER): Payer: No Typology Code available for payment source | Admitting: Sports Medicine

## 2013-02-26 VITALS — BP 134/82 | HR 74 | Temp 98.7°F | Ht 72.0 in | Wt 273.1 lb

## 2013-02-26 DIAGNOSIS — Z1211 Encounter for screening for malignant neoplasm of colon: Secondary | ICD-10-CM

## 2013-02-26 DIAGNOSIS — I10 Essential (primary) hypertension: Secondary | ICD-10-CM

## 2013-02-26 DIAGNOSIS — G250 Essential tremor: Secondary | ICD-10-CM | POA: Insufficient documentation

## 2013-02-26 DIAGNOSIS — Z0000 Encounter for general adult medical examination without abnormal findings: Secondary | ICD-10-CM

## 2013-02-26 DIAGNOSIS — K219 Gastro-esophageal reflux disease without esophagitis: Secondary | ICD-10-CM

## 2013-02-26 DIAGNOSIS — Z8546 Personal history of malignant neoplasm of prostate: Secondary | ICD-10-CM

## 2013-02-26 LAB — POCT SEDIMENTATION RATE: POCT SED RATE: 18 mm/hr (ref 0–22)

## 2013-02-26 MED ORDER — ATENOLOL 25 MG PO TABS: 25.0000 mg | ORAL_TABLET | Freq: Every day | ORAL | Status: DC

## 2013-02-26 MED ORDER — OMEPRAZOLE 20 MG PO CPDR: 20.0000 mg | DELAYED_RELEASE_CAPSULE | Freq: Two times a day (BID) | ORAL | Status: DC

## 2013-02-26 NOTE — Assessment & Plan Note (Signed)
Has been unable to follow up with Urology Recheck PSA and ESR

## 2013-02-26 NOTE — Progress Notes (Signed)
  [place]  Patient name: [name] MRN [id number]  Date of birth: [birth year]  CC & HPI:  [name] is a 67 y.o. male presenting today after being lost to follow up for ~ 1year for follow up of:  # Elevated PSA:  Has not been able to follow up with Urology inspite of elevated PSA.  Weight stable.  No new back pain  #  Hypertension - chronic problem, well controlled.    Home BP checks/ranges: 120-130s / 70s-80s   no orthostasis,   no peripheral edema  no chest pain, no dyspnea on exertion, no orthopnea/PND  no episodes of unilateral weakness, dysarthria or acute visual changes  # Reflux Sx:  Seems improved.  Continues PPI.  [facility] did not feel that EGD was warranted   ------------------------------------------------------------------------------------------------------------------ Medication Compliance: compliant all of the time  Diet Compliance: compliant all of the time ------------------------------------------------------------------------------------------------------------------ New Concerns:  # Shakes:  Reports he has had a shakiness in his body for the past 30 years but seems to be worsening.  Worse with activity.  No tremor at rest.  Getting to the point that he is having trouble drinking water or eating peas without spilling   ROS:  PER HPI  Pertinent History Reviewed:  Medical & Surgical Hx:  Reviewed: Significant for nephrectomy, GERD,  Medications: Reviewed & Updated - See associated section in EMR Social History: Reviewed -  reports that he has never smoked. He does not have any smokeless tobacco history on file.   Objective Findings:  Vitals: BP 156/82  Pulse 74  Temp(Src) 98.7 F (37.1 C) (Oral)  Ht 6' (1.829 m)  Wt 273 lb 1.6 oz (123.877 kg)  BMI 37.03 kg/m2  PE: GENERAL:   adult [place] American male.  Examined in [facility] .   sitting comfortably in exam chair   In no discomfort; norespiratory distress.   PSYCH: Alert and  appropriately interactive; Insight:Good   H&N: AT/[place], MMM, no scleral icterus, EOMi THORAX: HEART: RRR, S1/S2 heard, no murmur LUNGS: CTA B, no wheezes, no crackles ABDOMEN:  +BS, soft, non-tender, no rigidity, no guarding, no masses/organomegaly EXTREMITIES: Moves all 4 extremities spontaneously, warm well perfused, no edema, bilateral DP and PT pulses 2+/4.  Assessment & Plan:

## 2013-02-26 NOTE — Patient Instructions (Addendum)
It was nice to see you today.   Today we discussed: 1. GERD (gastroesophageal reflux disease) Refill for - omeprazole (PRILOSEC) 20 MG capsule; Take 1 capsule (20 mg total) by mouth 2 (two) times daily.  Dispense: 120 capsule; Refill: 0  2. History of prostate cancer We are checking some labs today, You need to follow up with UROLOGy - PSA - Sedimentation Rate  3. HTN (hypertension) Try: - atenolol (TENORMIN) 25 MG tablet; Take 1 tablet (25 mg total) by mouth daily.  Dispense: 30 tablet; Refill: 1  4. Benign essential tremor We are starting a medicine that should help  5. Healthcare maintenance  - LDL Cholesterol, Direct   Please plan to return to see me in 1 month.  If you need anything prior to seeing me please call the clinic.  Please Bring all medications with you to each appointment.

## 2013-02-26 NOTE — Assessment & Plan Note (Signed)
Atenolol >f/u tremor improvement vs orthostasis

## 2013-02-26 NOTE — Assessment & Plan Note (Addendum)
Initially markedly elevated; remains moderately elevated on recheck to 134/82. Atenolol for BB and tremor

## 2013-02-27 LAB — PSA: PSA: 174 ng/mL — ABNORMAL HIGH (ref <=4.00)

## 2013-03-02 ENCOUNTER — Encounter: Payer: Self-pay | Admitting: Sports Medicine

## 2013-03-02 NOTE — Assessment & Plan Note (Signed)
Completed in April.  Needs return in 5 year

## 2013-03-03 ENCOUNTER — Telehealth: Payer: Self-pay | Admitting: Sports Medicine

## 2013-03-03 NOTE — Telephone Encounter (Signed)
Multiple attempts to discuss PSA results with pt.  Unavailable currently. Voice Mail box not set up

## 2013-03-11 ENCOUNTER — Telehealth: Payer: Self-pay | Admitting: Sports Medicine

## 2013-03-12 ENCOUNTER — Encounter: Payer: Self-pay | Admitting: Sports Medicine

## 2013-03-12 NOTE — Telephone Encounter (Signed)
Attempted again Left message to return call Needs to return to Urologist for progressively increasing PSA Will send letter as well

## 2013-04-09 ENCOUNTER — Ambulatory Visit: Payer: Self-pay

## 2013-04-16 ENCOUNTER — Ambulatory Visit (INDEPENDENT_AMBULATORY_CARE_PROVIDER_SITE_OTHER): Payer: No Typology Code available for payment source | Admitting: Sports Medicine

## 2013-04-16 ENCOUNTER — Encounter: Payer: Self-pay | Admitting: Sports Medicine

## 2013-04-16 VITALS — BP 119/87 | HR 96 | Ht 72.0 in | Wt 268.8 lb

## 2013-04-16 DIAGNOSIS — Z8546 Personal history of malignant neoplasm of prostate: Secondary | ICD-10-CM

## 2013-04-16 DIAGNOSIS — I10 Essential (primary) hypertension: Secondary | ICD-10-CM

## 2013-04-16 DIAGNOSIS — M549 Dorsalgia, unspecified: Secondary | ICD-10-CM | POA: Insufficient documentation

## 2013-04-16 DIAGNOSIS — G250 Essential tremor: Secondary | ICD-10-CM

## 2013-04-16 NOTE — Assessment & Plan Note (Signed)
Continues to have essential tremor in spite of atenolol. >Consider referral to neurology once patient has Medicare

## 2013-04-16 NOTE — Patient Instructions (Signed)
It was nice to see you today, thanks for coming in!  Problem List Items Addressed This Visit   HTN (hypertension)     Stable/Well Controlled - no changes at this time.     History of prostate cancer     followup with urology at [facility] .  They should be calling in the next one to 2 weeks.  If you have not heard anything please call me      Relevant Orders      Ambulatory referral to Urology   Benign essential tremor         Back pain - Primary     remember to do your stretches and exercises. Lay on your side and razor foot up 15 times, 3 times per day.  Do this on both sides    Relevant Orders      DG Lumbar Spine Complete      DG Pelvis 1-2 Views       Please plan to return to see me in 2-3 months.  If you need anything prior to your next visit please call the clinic.  Please Bring all medications or accurate medication list with you to each appointment; an accurate medication list is essential in providing you the best care possible.

## 2013-04-16 NOTE — Assessment & Plan Note (Addendum)
Patient has a markedly elevated PSA we were unable to get in contact with him previously.  I have reviewed the results with him today.  I have encouraged him to followup with urology and will place a referral to urology at [facility]   Given  he is self pay.  Of note patient has had a 5 pound weight loss since last month.  He denies any nighttime sweats.  He does have worsening of chronic left SI and low back pain.  X-rays ordered.

## 2013-04-16 NOTE — Assessment & Plan Note (Signed)
Likely SI joint dysfunction.  Given history prostate cancer will order imaging.  Known elevated PSA and ESR and have encourage patient to followup with urology services ASAP.   Stretching exercises and hip abduction exercises provided.

## 2013-04-16 NOTE — Assessment & Plan Note (Signed)
Stable/Well Controlled - no changes at this time. 

## 2013-04-18 ENCOUNTER — Ambulatory Visit (HOSPITAL_COMMUNITY)
Admission: RE | Admit: 2013-04-18 | Discharge: 2013-04-18 | Disposition: A | Discharge status: Home / Self Care | Payer: No Typology Code available for payment source | Source: Ambulatory Visit | Attending: Family Medicine | Admitting: Family Medicine

## 2013-04-18 DIAGNOSIS — M549 Dorsalgia, unspecified: Secondary | ICD-10-CM

## 2013-04-18 DIAGNOSIS — M545 Low back pain, unspecified: Secondary | ICD-10-CM | POA: Insufficient documentation

## 2013-04-18 DIAGNOSIS — C61 Malignant neoplasm of prostate: Secondary | ICD-10-CM | POA: Insufficient documentation

## 2013-04-18 DIAGNOSIS — M899 Disorder of bone, unspecified: Secondary | ICD-10-CM | POA: Insufficient documentation

## 2013-04-20 NOTE — Progress Notes (Signed)
  [facility]  Patient name: [name] MRN [id number]  Date of birth: 19-May-1946  CC & HPI:  OSTIN [name] is a 67 y.o. male presenting to clinic.  Chief Complaint  Patient presents with  . Medical Managment of Chronic Issues    Hypertension, elevated PSA with history of metastatic prostate cancer,   #  Hypertension - chronic problem, adequately controlled.     no orthostasis,   no peripheral edema  no chest pain, no dyspnea on exertion, no orthopnea/PND  no episodes of unilateral weakness, dysarthria or acute visual changes  [facility] Compliance Diet: compliant most of the time  Exercise: noncompliant some of the time  MEDS: compliant all of the time    # Prostate: Patient has been unable to followup with urology .  He has had a markedly elevated PSA multiple attempts through phone calls and letters were made an attempt to contact the patient.  He is unable to afford at this time and is hoping to wait until he receives insurance at the end of the year 2 be seen.       #BACK PAIN  patient reports a slow insidious onset of left lower back pain that has been present for quite some time but has significantly worsened over the past one month.  He reports this is on the left posterior SI region.  He denies any radicular symptoms, no weakness, no numbness, no tingling.   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  He is not to followup with his urologist as above.   Denies any falls, no prior fractures , no major trauma.  Has not been trying any specific exercises for this     ROS:  PER HPI  Pertinent History Reviewed:  Medical & Surgical Hx:  Reviewed: Significant for prostate cancer Medications: Reviewed & Updated - see associated section Social History: Reviewed -  reports that he has never smoked. He does not have any smokeless tobacco history on file.  Objective Findings:  Vitals: BP  119/87  Pulse 96  Ht 6' (1.829 m)  Wt 268 lb 12.8 oz (121.927 kg)  BMI 36.45 kg/m2 PE: GENERAL:  obese African American male. In no discomfort; no respiratory distress  PSYCH:  alert and appropriate, good insight   HNEENT:    CARDIO:  RRR, S1/S2 heard, no murmur  LUNGS:  CTA B, no wheezes, no crackles  ABDOMEN:   protuberant, soft, nontender , no fluid wave   EXTREM:  moves all 4 extremities spontaneously, no lateralization, warm well perfused   GU:   SKIN:   NEUROMSK:  tender to palpation over the left SI joint, there is no midline tenderness of the spine.  Positive ASIS compression tests on the left.  Negative straight leg raise bilaterally, lower sure me strength is 5+/5 and all myotomes , hip abduction is 4-/5 bilaterally.    Assessment & Plan:   1. Back pain   2. History of prostate cancer   3. Benign essential tremor   4. HTN (hypertension)    See problem associated charting

## 2013-04-23 ENCOUNTER — Ambulatory Visit: Payer: No Typology Code available for payment source | Admitting: Sports Medicine

## 2013-05-12 ENCOUNTER — Telehealth: Payer: Self-pay | Admitting: Sports Medicine

## 2013-05-12 NOTE — Telephone Encounter (Signed)
Patient is calling wanting to have another PSA done. Patient would like to have this done by 05/28/13.

## 2013-05-12 NOTE — Telephone Encounter (Signed)
I do not recommend that he have repeat PSA.  He needs to be seen by a urologist given he has had a persistently increasing PSA over the past year and 1/2.  Regardless of what this PSA would show I recommend that he be seen for repeat tissue testing by the Urologist.

## 2013-05-13 NOTE — Telephone Encounter (Signed)
related message,patient voiced understanding. Judit Awad, Lewie Loron

## 2013-07-22 ENCOUNTER — Telehealth: Payer: Self-pay | Admitting: *Deleted

## 2013-07-22 NOTE — Telephone Encounter (Signed)
Absolutely the patient needs to be seen for his prostate cancer.  His PSA is markedly elevated I am concerned that he is having a recurrence.  I have encouraged him to followup with whichever urologist he is able to be seen by but repeatedly becomes lost to followup and is unable to be reached on his home telephone.    If he is able to be seen by [facility] I am grateful for this and believe it would be in the patient's best interest.

## 2013-07-22 NOTE — Telephone Encounter (Signed)
Patient was seen in August by Dr. Paulla Fore and referred to [facility].  Patient was seen by Dr. Janice Norrie in 2011 and was told to follow up with their office.  Patient needs to be referred back to [facility] since he has the orange card and is already established with their office.  [facility] can use previous urology referral and will make an appt for [facility].  Nothing further needs to be done.  Will inform Dr. Paulla Fore.  Nolene Ebbs, RN

## 2013-07-23 NOTE — Telephone Encounter (Signed)
Spoke with Lynn Ito at [facility] is currently reviewing patient's info.  They will fax appt info to [facility] and to our office when it has been scheduled.  Nolene Ebbs, RN

## 2014-02-02 ENCOUNTER — Ambulatory Visit: Payer: Self-pay

## 2015-08-16 ENCOUNTER — Emergency Department (HOSPITAL_COMMUNITY)
Admission: EM | Admit: 2015-08-16 | Discharge: 2015-08-16 | Disposition: A | Discharge status: Home / Self Care | Payer: Medicare Other | Attending: Emergency Medicine | Admitting: Emergency Medicine

## 2015-08-16 ENCOUNTER — Encounter (HOSPITAL_COMMUNITY): Payer: Self-pay | Admitting: Emergency Medicine

## 2015-08-16 DIAGNOSIS — S61219A Laceration without foreign body of unspecified finger without damage to nail, initial encounter: Secondary | ICD-10-CM

## 2015-08-16 DIAGNOSIS — Z85528 Personal history of other malignant neoplasm of kidney: Secondary | ICD-10-CM | POA: Diagnosis not present

## 2015-08-16 DIAGNOSIS — Y9389 Activity, other specified: Secondary | ICD-10-CM | POA: Diagnosis not present

## 2015-08-16 DIAGNOSIS — Z8639 Personal history of other endocrine, nutritional and metabolic disease: Secondary | ICD-10-CM | POA: Diagnosis not present

## 2015-08-16 DIAGNOSIS — Z7982 Long term (current) use of aspirin: Secondary | ICD-10-CM | POA: Diagnosis not present

## 2015-08-16 DIAGNOSIS — S61214A Laceration without foreign body of right ring finger without damage to nail, initial encounter: Secondary | ICD-10-CM | POA: Insufficient documentation

## 2015-08-16 DIAGNOSIS — K219 Gastro-esophageal reflux disease without esophagitis: Secondary | ICD-10-CM | POA: Insufficient documentation

## 2015-08-16 DIAGNOSIS — Z87448 Personal history of other diseases of urinary system: Secondary | ICD-10-CM | POA: Insufficient documentation

## 2015-08-16 DIAGNOSIS — Z23 Encounter for immunization: Secondary | ICD-10-CM | POA: Diagnosis not present

## 2015-08-16 DIAGNOSIS — Z79899 Other long term (current) drug therapy: Secondary | ICD-10-CM | POA: Insufficient documentation

## 2015-08-16 DIAGNOSIS — S6991XA Unspecified injury of right wrist, hand and finger(s), initial encounter: Secondary | ICD-10-CM | POA: Diagnosis present

## 2015-08-16 DIAGNOSIS — Z8546 Personal history of malignant neoplasm of prostate: Secondary | ICD-10-CM | POA: Insufficient documentation

## 2015-08-16 DIAGNOSIS — W290XXA Contact with powered kitchen appliance, initial encounter: Secondary | ICD-10-CM | POA: Insufficient documentation

## 2015-08-16 DIAGNOSIS — Y998 Other external cause status: Secondary | ICD-10-CM | POA: Diagnosis not present

## 2015-08-16 DIAGNOSIS — Y9289 Other specified places as the place of occurrence of the external cause: Secondary | ICD-10-CM | POA: Insufficient documentation

## 2015-08-16 DIAGNOSIS — S61216A Laceration without foreign body of right little finger without damage to nail, initial encounter: Secondary | ICD-10-CM | POA: Insufficient documentation

## 2015-08-16 MED ORDER — LIDOCAINE HCL 1 % IJ SOLN
20.0000 mL | Freq: Once | INTRAMUSCULAR | Status: AC
  Filled 2015-08-16: qty 20

## 2015-08-16 MED ORDER — TETANUS-DIPHTH-ACELL PERTUSSIS 5-2.5-18.5 LF-MCG/0.5 IM SUSP
0.5000 mL | Freq: Once | INTRAMUSCULAR | Status: AC
  Administered 2015-08-16: 0.5 mL via INTRAMUSCULAR
  Filled 2015-08-16: qty 0.5

## 2015-08-16 NOTE — ED Notes (Signed)
PA wrapped digits with bulky dressings and gave patient education.

## 2015-08-16 NOTE — Discharge Instructions (Signed)
1. Medications: usual home medications 2. Treatment: rest, drink plenty of fluids, change dressing daily and keep clean and dry 3. Follow Up: please followup with your primary doctor this week for discussion of your diagnoses and further evaluation after today's visit; if you do not have a primary care doctor use the resource guide provided to find one; please return to the ER for increased pain or swelling, persistent bleeding    Nonsutured Laceration Care A laceration is a cut that goes through all layers of the skin and extends into the tissue that is right under the skin. This type of cut is usually stitched up (sutured) or closed with tape (adhesive strips) or skin glue shortly after the injury happens. However, if the wound is dirty or if several hours pass before medical treatment is provided, it is likely that germs (bacteria) will enter the wound. Closing a laceration after bacteria have entered it increases the risk of infection. In these cases, your health care provider may leave the laceration open (nonsutured) and cover it with a bandage. This type of treatment helps prevent infection and allows the wound to heal from the deepest layer of tissue damage up to the surface. An open fracture is a type of injury that may involve nonsutured lacerations. An open fracture is a break in a bone that happens along with one or more lacerations through the skin that is near the fracture site. HOW TO CARE FOR YOUR NONSUTURED LACERATION  Take or apply over-the-counter and prescription medicines only as told by your health care provider.  If you were prescribed an antibiotic medicine, take or apply it as told by your health care provider. Do not stop using the antibiotic even if your condition improves.  Clean the wound one time each day or as told by your health care provider.  Wash the wound with mild soap and water.  Rinse the wound with water to remove all soap.  Pat your wound dry with a  clean towel. Do not rub the wound.  Do not inject anything into the wound unless your health care provider told you to.  Change any bandages (dressings) as told by your health care provider. This includes changing the dressing if it gets wet, dirty, or starts to smell bad.  Keep the dressing dry until your health care provider says it can be removed. Do not take baths, swim, or do anything that puts your wound underwater until your health care provider approves.  Raise (elevate) the injured area above the level of your heart while you are sitting or lying down, if possible.  Do not scratch or pick at the wound.  Check your wound every day for signs of infection. Watch for:  Redness, swelling, or pain.  Fluid, blood, or pus.  Keep all follow-up visits as told by your health care provider. This is important. SEEK MEDICAL CARE IF:  You received a tetanus and shot and you have swelling, severe pain, redness, or bleeding at the injection site.   You have a fever.  Your pain is not controlled with medicine.  You have increased redness, swelling, or pain at the site of your wound.  You have fluid, blood, or pus coming from your wound.  You notice a bad smell coming from your wound or your dressing.  You notice something coming out of the wound, such as wood or glass.  You notice a change in the color of your skin near your wound.  You develop a  new rash.  You need to change the dressing frequently due to fluid, blood, or pus draining from the wound.  You develop numbness around your wound. SEEK IMMEDIATE MEDICAL CARE IF:  Your pain suddenly increases and is severe.  You develop severe swelling around the wound.  The wound is on your hand or foot and you cannot properly move a finger or toe.  The wound is on your hand or foot and you notice that your fingers or toes look pale or bluish.  You have a red streak going away from your wound.   This information is not  intended to replace advice given to you by your health care provider. Make sure you discuss any questions you have with your health care provider.   Document Released: 07/12/2006 Document Revised: 12/29/2014 Document Reviewed: 08/10/2014 Elsevier Interactive Patient Education [employer].

## 2015-08-16 NOTE — ED Notes (Signed)
Per pt, states he was slicing cucumbers with hand held slicer-cut right ring finger and right little finger

## 2015-08-16 NOTE — ED Provider Notes (Signed)
CSN: [id number]     Arrival date & time 08/16/15  [id number] History   First MD Initiated Contact with Patient 08/16/15 1024     Chief Complaint  Patient presents with  . finger laceration     HPI   [name] is a 69 y.o. male with a PMH of HLD, GERD, prostate cancer who presents to the ED with finger laceration, which occurred this morning prior to arrival. He states he was slicing cucumbers when he cut his right 4th and 5th fingers. He reports the bleeding was controlled after applying pressure. He denies significant pain, numbness, weakness, paresthesia. He denies anticoagulant use. He states his tetanus is not up to date.   Past Medical History  Diagnosis Date  . Renal insufficiency   . Allergy   . Hyperlipidemia   . Cancer ([place])     prostate /renal ca  . GERD (gastroesophageal reflux disease)    Past Surgical History  Procedure Laterality Date  . Nephrectomy    . Colonoscopy  04/17/12    Poor prep  . Colonoscopy  12/20/12    Repeat, 1 Polyp - needs 62yr f/u   Family History  Problem Relation Age of Onset  . Asthma Mother   . Cancer Maternal Grandmother   . Cancer Maternal Grandfather    Social History  Substance Use Topics  . Smoking status: Never Smoker   . Smokeless tobacco: None  . Alcohol Use: None      Review of Systems  Skin: Positive for wound.  Neurological: Negative for weakness and numbness.      Allergies  Review of patient's allergies indicates no known allergies.  Home Medications   Prior to Admission medications   Medication Sig Start Date End Date Taking? Authorizing Provider  aspirin 81 MG tablet Take 81 mg by mouth daily.      Historical Provider, MD  atenolol (TENORMIN) 25 MG tablet Take 1 tablet (25 mg total) by mouth daily. 02/26/13   Gerda Diss, MD  cholecalciferol (VITAMIN D) 1000 UNITS tablet Take 1,000 Units by mouth daily.      Historical Provider, MD  omeprazole (PRILOSEC) 20 MG capsule Take 1 capsule (20 mg total) by mouth  2 (two) times daily. 02/26/13 02/26/14  Gerda Diss, MD  UNABLE TO FIND 1 capsule 2 (two) times daily. KIDNEY ESSENTIAL - [place]- Multivitamin with C, Bs, Cranberry, Juniper, Cork silk, uva ursi Choline, inositol, PABA    Historical Provider, MD    BP 159/93 mmHg  Pulse 86  Temp(Src) 98.1 F (36.7 C) (Oral)  Resp 16  SpO2 98% Physical Exam  Constitutional: He is oriented to person, place, and time. He appears well-developed and well-nourished. No distress.  HENT:  Head: Normocephalic and atraumatic.  Right Ear: External ear normal.  Left Ear: External ear normal.  Nose: Nose normal.  Eyes: Conjunctivae and EOM are normal. Right eye exhibits no discharge. Left eye exhibits no discharge. No scleral icterus.  Neck: Normal range of motion. Neck supple.  Cardiovascular: Normal rate and regular rhythm.   Pulmonary/Chest: Effort normal and breath sounds normal. No respiratory distress.  Musculoskeletal: Normal range of motion. He exhibits no edema or tenderness.  Neurological: He is alert and oriented to person, place, and time.  Skin: Skin is warm and dry. He is not diaphoretic.  Laceration to distal aspect of right 4th and 5th fingers.  Psychiatric: He has a normal mood and affect. His behavior is normal.  Nursing note and  vitals reviewed.   ED Course  Procedures (including critical care time)  Labs Review Labs Reviewed - No data to display  Imaging Review No results found.     EKG Interpretation None      MDM   Final diagnoses:  Finger laceration, initial encounter    69 year old male presents with laceration to his right 4th and 5th fingers, which he states occurred today prior to arrival. Denies weakness, numbness, paresthesia, anticoagulant use. Patient is afebrile. Vital signs stable. On exam, no repairable laceration, patient cut off distal soft tissue of right 4th and 5th fingers. No bony involvement.   Tetanus updated. Wound irrigated and cleaned. Wound  dressed, hemostasis achieved. Patient to follow-up with PCP. Return precautions discussed. Patient verbalizes his understanding and is in agreement with plan.  BP 159/93 mmHg  Pulse 86  Temp(Src) 98.1 F (36.7 C) (Oral)  Resp 16  SpO2 98%     Marella Chimes, PA-C 08/16/15 1340  Pattricia Boss, MD 08/18/15 [id number]

## 2015-08-16 NOTE — ED Notes (Addendum)
lacerations to the ring and little fingers on the right hand. Bleeding. Irrigation and cleaning completed.  ADDENDUM Tips of finger W/ lacerations. Bleeding no sutures needed.

## 2019-08-25 ENCOUNTER — Other Ambulatory Visit: Payer: Self-pay | Admitting: Family

## 2019-08-25 DIAGNOSIS — R748 Abnormal levels of other serum enzymes: Secondary | ICD-10-CM

## 2019-08-27 ENCOUNTER — Other Ambulatory Visit (HOSPITAL_COMMUNITY): Payer: Self-pay | Admitting: Family

## 2019-08-27 DIAGNOSIS — I10 Essential (primary) hypertension: Secondary | ICD-10-CM

## 2019-08-28 ENCOUNTER — Ambulatory Visit (HOSPITAL_COMMUNITY): Payer: Medicare Other | Attending: Cardiovascular Disease

## 2019-08-28 ENCOUNTER — Other Ambulatory Visit: Payer: Self-pay

## 2019-08-28 DIAGNOSIS — I10 Essential (primary) hypertension: Secondary | ICD-10-CM

## 2019-08-28 MED ORDER — PERFLUTREN LIPID MICROSPHERE
1.0000 mL | INTRAVENOUS | Status: AC | PRN
  Administered 2019-08-28: 2 mL via INTRAVENOUS

## 2019-09-03 ENCOUNTER — Ambulatory Visit
Admission: RE | Admit: 2019-09-03 | Discharge: 2019-09-03 | Disposition: A | Discharge status: Home / Self Care | Payer: Medicare Other | Source: Ambulatory Visit | Attending: Family | Admitting: Family

## 2019-09-03 DIAGNOSIS — R748 Abnormal levels of other serum enzymes: Secondary | ICD-10-CM

## 2019-09-08 ENCOUNTER — Encounter: Payer: Self-pay | Admitting: Physician Assistant

## 2019-09-08 ENCOUNTER — Other Ambulatory Visit (INDEPENDENT_AMBULATORY_CARE_PROVIDER_SITE_OTHER): Payer: Medicare HMO

## 2019-09-08 ENCOUNTER — Other Ambulatory Visit: Payer: Self-pay

## 2019-09-08 ENCOUNTER — Ambulatory Visit (INDEPENDENT_AMBULATORY_CARE_PROVIDER_SITE_OTHER): Payer: Medicare HMO | Admitting: Physician Assistant

## 2019-09-08 VITALS — BP 138/80 | HR 97 | Temp 97.5°F | Ht 72.0 in | Wt 290.0 lb

## 2019-09-08 DIAGNOSIS — K760 Fatty (change of) liver, not elsewhere classified: Secondary | ICD-10-CM | POA: Diagnosis not present

## 2019-09-08 DIAGNOSIS — R1013 Epigastric pain: Secondary | ICD-10-CM

## 2019-09-08 DIAGNOSIS — K219 Gastro-esophageal reflux disease without esophagitis: Secondary | ICD-10-CM | POA: Diagnosis not present

## 2019-09-08 DIAGNOSIS — R799 Abnormal finding of blood chemistry, unspecified: Secondary | ICD-10-CM

## 2019-09-08 DIAGNOSIS — R748 Abnormal levels of other serum enzymes: Secondary | ICD-10-CM

## 2019-09-08 DIAGNOSIS — D649 Anemia, unspecified: Secondary | ICD-10-CM

## 2019-09-08 LAB — COMPREHENSIVE METABOLIC PANEL
ALT: 22 U/L (ref 0–53)
AST: 18 U/L (ref 0–37)
Albumin: 4.2 g/dL (ref 3.5–5.2)
Alkaline Phosphatase: 69 U/L (ref 39–117)
BUN: 50 mg/dL — ABNORMAL HIGH (ref 6–23)
CO2: 20 mEq/L (ref 19–32)
Calcium: 9.4 mg/dL (ref 8.4–10.5)
Chloride: 108 mEq/L (ref 96–112)
Creatinine, Ser: 3.64 mg/dL — ABNORMAL HIGH (ref 0.40–1.50)
GFR: 20.06 mL/min — ABNORMAL LOW (ref >60.00)
Glucose, Bld: 152 mg/dL — ABNORMAL HIGH (ref 70–99)
Potassium: 4.4 mEq/L (ref 3.5–5.1)
Sodium: 137 mEq/L (ref 135–145)
Total Bilirubin: 0.5 mg/dL (ref 0.2–1.2)
Total Protein: 7.4 g/dL (ref 6.0–8.3)

## 2019-09-08 LAB — CBC WITH DIFFERENTIAL/PLATELET
Basophils Absolute: 0.1 10*3/uL (ref 0.0–0.1)
Basophils Relative: 1.2 % (ref 0.0–3.0)
Eosinophils Absolute: 0.2 10*3/uL (ref 0.0–0.7)
Eosinophils Relative: 4.4 % (ref 0.0–5.0)
HCT: 35.6 % — ABNORMAL LOW (ref 39.0–52.0)
Hemoglobin: 11.8 g/dL — ABNORMAL LOW (ref 13.0–17.0)
Lymphocytes Relative: 28.5 % (ref 12.0–46.0)
Lymphs Abs: 1.3 10*3/uL (ref 0.7–4.0)
MCHC: 33 g/dL (ref 30.0–36.0)
MCV: 97 fl (ref 78.0–100.0)
Monocytes Absolute: 0.4 10*3/uL (ref 0.1–1.0)
Monocytes Relative: 9.4 % (ref 3.0–12.0)
Neutro Abs: 2.7 10*3/uL (ref 1.4–7.7)
Neutrophils Relative %: 56.5 % (ref 43.0–77.0)
Platelets: 174 10*3/uL (ref 150.0–400.0)
RBC: 3.67 Mil/uL — ABNORMAL LOW (ref 4.22–5.81)
RDW: 12.9 % (ref 11.5–15.5)
WBC: 4.7 10*3/uL (ref 4.0–10.5)

## 2019-09-08 LAB — LIPASE: Lipase: 99 U/L — ABNORMAL HIGH (ref 11.0–59.0)

## 2019-09-08 LAB — AMYLASE: Amylase: 506 U/L — ABNORMAL HIGH (ref 27–131)

## 2019-09-08 NOTE — Progress Notes (Addendum)
Chief Complaint: Abnormal blood work, epigastric pain  HPI:    Mr. Pile is a 74 year old African-American male with a past medical history as listed below including reflux, who was referred to me by Sonia Side., FNP for a complaint of epigastric pain and abnormal blood work.      09/03/2019 abdominal ultrasound complete with evidence of chronic liver disease, chronic hepatic steatosis which is also present on 2011 CT, visible pancreas was within normal limits, negative gallbladder, no evidence of acute biliary obstruction, solitary left kidney with polycystic disease which had progressed since 2011 with a single complex cystic lesion at the left midpole measuring 3.7 cm which is probably benign.    Today, the patient presents to clinic, we were unable to get any records for him prior to his visit.  He tells me that he was recently told he had abnormal pancreatic or liver labs are both.  Tells me that in general he had been feeling well other than about a month ago developing some epigastric discomfort after eating greasy foods or acidic things such as orange juice/oranges.  His PCP apparently started him on Omeprazole, he is taking this 40 mg every morning and 20 mg every afternoon and this is helping.  He has had little discomfort over the past week or so.    Reports a previous colonoscopy at [facility] less than 5 years ago.    Denies a family history of liver disease, IV drug use, use of supplements or excessive Tylenol.    Denies fever, chills, weight loss, heartburn, reflux or change in bowel habits.  Past Medical History:  Diagnosis Date  . Allergy   . Cancer ([place])    prostate /renal ca  . GERD (gastroesophageal reflux disease)   . Hyperlipidemia   . Renal insufficiency     Past Surgical History:  Procedure Laterality Date  . COLONOSCOPY  04/17/12   Poor prep  . COLONOSCOPY  12/20/12   Repeat, 1 Polyp - needs 22yrf/u  . NEPHRECTOMY      Current Outpatient Medications   Medication Sig Dispense Refill  . aspirin 81 MG tablet Take 81 mg by mouth daily.      .Marland Kitchenatenolol (TENORMIN) 25 MG tablet Take 1 tablet (25 mg total) by mouth daily. 30 tablet 1  . cholecalciferol (VITAMIN D) 1000 UNITS tablet Take 1,000 Units by mouth daily.      .Marland Kitchenomeprazole (PRILOSEC) 20 MG capsule Take 1 capsule (20 mg total) by mouth 2 (two) times daily. 120 capsule 0  . UNABLE TO FIND 1 capsule 2 (two) times daily. KIDNEY ESSENTIAL - [place]- Multivitamin with C, Bs, Cranberry, Juniper, Cork silk, uva ursi Choline, inositol, PABA     No current facility-administered medications for this visit.    Allergies as of 09/08/2019  . (No Known Allergies)    Family History  Problem Relation Age of Onset  . Asthma Mother   . Cancer Maternal Grandmother   . Cancer Maternal Grandfather     Social History   Socioeconomic History  . Marital status: Single    Spouse name: Not on file  . Number of children: Not on file  . Years of education: Not on file  . Highest education level: Not on file  Occupational History  . Not on file  Tobacco Use  . Smoking status: Never Smoker  Substance and Sexual Activity  . Alcohol use: Not on file  . Drug use: Not on file  .  Sexual activity: Not on file  Other Topics Concern  . Not on file  Social History Narrative  . Not on file   Social Determinants of Health   Financial Resource Strain:   . Difficulty of Paying Living Expenses: Not on file  Food Insecurity:   . Worried About Charity fundraiser in the Last Year: Not on file  . Ran Out of Food in the Last Year: Not on file  Transportation Needs:   . Lack of Transportation (Medical): Not on file  . Lack of Transportation (Non-Medical): Not on file  Physical Activity:   . Days of Exercise per Week: Not on file  . Minutes of Exercise per Session: Not on file  Stress:   . Feeling of Stress : Not on file  Social Connections:   . Frequency of Communication with Friends and Family: Not  on file  . Frequency of Social Gatherings with Friends and Family: Not on file  . Attends Religious Services: Not on file  . Active Member of Clubs or Organizations: Not on file  . Attends Archivist Meetings: Not on file  . Marital Status: Not on file  Intimate Partner Violence:   . Fear of Current or Ex-Partner: Not on file  . Emotionally Abused: Not on file  . Physically Abused: Not on file  . Sexually Abused: Not on file    Review of Systems:    Constitutional: No weight loss, fever or chills Skin: No rash  Cardiovascular: No chest pain   Respiratory: No SOB Gastrointestinal: See HPI and otherwise negative Genitourinary: No dysuria  Neurological: No headache, dizziness or syncope Musculoskeletal: No new muscle or joint pain Hematologic: No bleeding  Psychiatric: No history of depression or anxiety   Physical Exam:  Vital signs: BP 138/80   Pulse 97   Temp (!) 97.5 F (36.4 C)   Ht 6' (1.829 m)   Wt 290 lb (131.5 kg)   SpO2 100%   BMI 39.33 kg/m   Constitutional:   Pleasant AA male appears to be in NAD, Well developed, Well nourished, alert and cooperative Head:  Normocephalic and atraumatic. Eyes:   PEERL, EOMI. No icterus. Conjunctiva pink. Ears:  Normal auditory acuity. Neck:  Supple Throat: Oral cavity and pharynx without inflammation, swelling or lesion.  Respiratory: Respirations even and unlabored. Lungs clear to auscultation bilaterally.   No wheezes, crackles, or rhonchi.  Cardiovascular: Normal S1, S2. No MRG. Regular rate and rhythm. No peripheral edema, cyanosis or pallor.  Gastrointestinal:  Soft, nondistended, nontender. No rebound or guarding. Normal bowel sounds. No appreciable masses or hepatomegaly. Rectal:  Not performed.  Msk:  Symmetrical without gross deformities. Without edema, no deformity or joint abnormality.  Neurologic:  Alert and  oriented x4;  grossly normal neurologically.  Skin:   Dry and intact without significant lesions  or rashes. Psychiatric: Demonstrates good judgement and reason without abnormal affect or behaviors.  Requesting recent labs.  Assessment: 1.  Abnormal labs: Requesting labs from PCP to decipher exactly what was abnormal, per order for ultrasound it looks like alk phos?,  Recent ultrasound showed fatty liver 2.  Epigastric pain: Likely with reflux/gastritis as this is better now with Omeprazole 3.  GERD 4.  Fatty liver: As seen on ultrasound  Plan: 1.  Requested recent labs for the patient, he reports these were done 3 weeks ago. 2.  For now will repeat CMP, CBC, amylase and lipase. 3.  Discussed diagnosis of fatty liver the patient,  encouraged a 1 to 2 pound weight loss per week. 4.  Continue Omeprazole 40 mg every morning, 30 minutes before breakfast and 20 mg every afternoon 30 minutes before dinner. 5.  Patient will follow in clinic/do further testing pending labs above.  Patient was assigned to Dr. Tarri Glenn this morning.  Ellouise Newer, PA-C [facility] 09/08/2019, 11:01 AM  Cc: Sonia Side., FNP,  Addendum: 09/08/2019 11:41 AM  Received records from PCP  08/18/2019 labs show CMP with a BUN elevated at 33, creatinine 3.32 and otherwise normal including all liver enzymes.  Amylase was elevated at 600 (21-101), lipase elevated 90 (7-60) triglycerides 163, CBC with a hemoglobin of 13.1, minimally decreased and otherwise normal, ANA negative, CRP normal, hepatitis C antibody with reflex nonreactive  Will see what labs above show now, pending trending of pancreatic enzymes patient may benefit from a CT abdomen for further evaluation.  Ellouise Newer, PA-C

## 2019-09-08 NOTE — Progress Notes (Signed)
Reviewed and agree with management plans. ? ?Kynsie Falkner L. Staton Markey, MD, MPH  ?

## 2019-09-08 NOTE — Patient Instructions (Signed)
If you are age 72 or older, your body mass index should be between 23-30. Your Body mass index is 39.33 kg/m. If this is out of the aforementioned range listed, please consider follow up with your Primary Care Provider.  If you are age 64 or younger, your body mass index should be between 19-25. Your Body mass index is 39.33 kg/m. If this is out of the aformentioned range listed, please consider follow up with your Primary Care Provider.   Your provider has requested that you go to the basement level for lab work before leaving today. Press "B" on the elevator. The lab is located at the first door on the left as you exit the elevator.   Continue Omeprazole 40 mg in the morning. Continue Omeprazole 20 mg in the evening.

## 2019-09-09 ENCOUNTER — Telehealth: Payer: Self-pay

## 2019-09-09 NOTE — Telephone Encounter (Signed)
Spoke to patient and gave CT-abd w/just oral contrast appt. Scheduled at [facility] on 09/16/19.Patient to arrive at 9:15am and go to radiology. To be NPO 4 hours before. To pick-up the oral contrast at [facility] radiology before 09/16/19 and they will give patient instructions for taking (pancreatic protocol)

## 2019-09-16 ENCOUNTER — Encounter (HOSPITAL_COMMUNITY): Payer: Self-pay

## 2019-09-16 ENCOUNTER — Other Ambulatory Visit: Payer: Self-pay

## 2019-09-16 ENCOUNTER — Ambulatory Visit (HOSPITAL_COMMUNITY)
Admission: RE | Admit: 2019-09-16 | Discharge: 2019-09-16 | Disposition: A | Discharge status: Home / Self Care | Payer: Medicare HMO | Source: Ambulatory Visit | Attending: Physician Assistant | Admitting: Physician Assistant

## 2019-09-16 DIAGNOSIS — R1013 Epigastric pain: Secondary | ICD-10-CM | POA: Diagnosis not present

## 2019-09-22 ENCOUNTER — Other Ambulatory Visit: Payer: Self-pay | Admitting: Family

## 2019-09-22 DIAGNOSIS — G250 Essential tremor: Secondary | ICD-10-CM

## 2019-09-24 ENCOUNTER — Other Ambulatory Visit (HOSPITAL_COMMUNITY): Payer: Self-pay | Admitting: Family

## 2019-09-24 DIAGNOSIS — G250 Essential tremor: Secondary | ICD-10-CM

## 2019-09-26 ENCOUNTER — Other Ambulatory Visit: Payer: Self-pay | Admitting: Family

## 2019-09-26 ENCOUNTER — Other Ambulatory Visit (HOSPITAL_COMMUNITY): Payer: Self-pay | Admitting: Family

## 2019-09-29 ENCOUNTER — Other Ambulatory Visit (HOSPITAL_COMMUNITY): Payer: Self-pay | Admitting: Family

## 2019-09-29 DIAGNOSIS — I10 Essential (primary) hypertension: Secondary | ICD-10-CM

## 2019-09-30 ENCOUNTER — Other Ambulatory Visit (HOSPITAL_COMMUNITY): Payer: Self-pay | Admitting: Family

## 2019-09-30 DIAGNOSIS — G250 Essential tremor: Secondary | ICD-10-CM

## 2019-09-30 DIAGNOSIS — Z8546 Personal history of malignant neoplasm of prostate: Secondary | ICD-10-CM

## 2019-10-01 ENCOUNTER — Encounter: Payer: Self-pay | Admitting: Physician Assistant

## 2019-10-01 ENCOUNTER — Ambulatory Visit (INDEPENDENT_AMBULATORY_CARE_PROVIDER_SITE_OTHER): Payer: Medicare HMO | Admitting: Physician Assistant

## 2019-10-01 ENCOUNTER — Other Ambulatory Visit (INDEPENDENT_AMBULATORY_CARE_PROVIDER_SITE_OTHER): Payer: Medicare HMO

## 2019-10-01 VITALS — BP 130/66 | HR 88 | Temp 97.9°F | Ht 72.0 in | Wt 290.4 lb

## 2019-10-01 DIAGNOSIS — R935 Abnormal findings on diagnostic imaging of other abdominal regions, including retroperitoneum: Secondary | ICD-10-CM

## 2019-10-01 DIAGNOSIS — R748 Abnormal levels of other serum enzymes: Secondary | ICD-10-CM | POA: Diagnosis not present

## 2019-10-01 LAB — LIPASE: Lipase: 94 U/L — ABNORMAL HIGH (ref 11.0–59.0)

## 2019-10-01 LAB — AMYLASE: Amylase: 513 U/L — ABNORMAL HIGH (ref 27–131)

## 2019-10-01 NOTE — Progress Notes (Signed)
Chief Complaint: Follow-up elevated amylase and lipase and epigastric pain  HPI:    [name] is a 74 year old African-American male with a past medical history as listed below including prostate cancer, reflux and renal insufficiency, who returns to clinic today for follow-up of his epigastric pain and abnormal blood work.    09/08/2019 patient seen in clinic by me.  At that time he told me that he had recently had abnormal pancreatic or liver labs.  Did not have records at that time.  His PCP had recently started him on omeprazole and was taking 40 mg every morning and 20 mg every afternoon and this is helping his epigastric pain.  Previous CT had showed fatty liver.  We repeated a CMP, CBC, amylase and lipase.  Records were later received from 08/18/2019 and showed an amylase elevated at 600, lipase elevated at 90.    09/08/2019 repeat labs showed a creatinine of 3.64, lipase elevated at 99 and amylase elevated at 506.    09/16/2019 patient had a CT of the abdomen with oral contrast which showed unremarkable pancreas, hepatic steatosis and a new subcentimeter sclerotic focus in the inferior left scapula which was indeterminate and thought to possibly reflect a sclerotic metastasis.  As well as a small hiatal hernia.  At that time results were sent to his oncologist and patient was told to follow-up with them.    Today, the patient tells me that he feels well.  His epigastric pain is completely gone and he is only using his Omeprazole 20 mg once in the morning.  Explains that he does have follow-up with his oncology team and they have already ordered an MRI of his scapula as well as an MRI of his head.  Denies any new complaints or concerns.  Past Medical History:  Diagnosis Date  . Allergy   . Cancer ([place])    prostate /renal ca  . GERD (gastroesophageal reflux disease)   . Hyperlipidemia   . Renal insufficiency     Past Surgical History:  Procedure Laterality Date  . COLONOSCOPY  04/17/12     Poor prep  . COLONOSCOPY  12/20/12   Repeat, 1 Polyp - needs 34yrf/u  . FOOT SURGERY    . NEPHRECTOMY      Current Outpatient Medications  Medication Sig Dispense Refill  . atenolol (TENORMIN) 25 MG tablet Take 1 tablet (25 mg total) by mouth daily. 30 tablet 1  . cholecalciferol (VITAMIN D) 1000 UNITS tablet Take 1,000 Units by mouth daily.      .Marland Kitchenomeprazole (PRILOSEC) 20 MG capsule Take 20 mg by mouth 2 (two) times daily before a meal.     No current facility-administered medications for this visit.    Allergies as of 10/01/2019  . (No Known Allergies)    Family History  Problem Relation Age of Onset  . Asthma Mother   . Cancer Maternal Grandmother   . Cancer Maternal Grandfather     Social History   Socioeconomic History  . Marital status: Single    Spouse name: Not on file  . Number of children: Not on file  . Years of education: Not on file  . Highest education level: Not on file  Occupational History  . Occupation: retired  Tobacco Use  . Smoking status: Never Smoker  . Smokeless tobacco: Never Used  Substance and Sexual Activity  . Alcohol use: Never  . Drug use: Never  . Sexual activity: Not on file  Other Topics  Concern  . Not on file  Social History Narrative  . Not on file   Social Determinants of Health   Financial Resource Strain:   . Difficulty of Paying Living Expenses: Not on file  Food Insecurity:   . Worried About Charity fundraiser in the Last Year: Not on file  . Ran Out of Food in the Last Year: Not on file  Transportation Needs:   . Lack of Transportation (Medical): Not on file  . Lack of Transportation (Non-Medical): Not on file  Physical Activity:   . Days of Exercise per Week: Not on file  . Minutes of Exercise per Session: Not on file  Stress:   . Feeling of Stress : Not on file  Social Connections:   . Frequency of Communication with Friends and Family: Not on file  . Frequency of Social Gatherings with Friends and  Family: Not on file  . Attends Religious Services: Not on file  . Active Member of Clubs or Organizations: Not on file  . Attends Archivist Meetings: Not on file  . Marital Status: Not on file  Intimate Partner Violence:   . Fear of Current or Ex-Partner: Not on file  . Emotionally Abused: Not on file  . Physically Abused: Not on file  . Sexually Abused: Not on file    Review of Systems:    Constitutional: No weight loss, fever or chills Cardiovascular: No chest pain Respiratory: No SOB  Gastrointestinal: See HPI and otherwise negative   Physical Exam:  Vital signs: BP 130/66   Pulse 88   Temp 97.9 F (36.6 C)   Ht 6' (1.829 m)   Wt 290 lb 6 oz (131.7 kg)   BMI 39.38 kg/m   Constitutional:   Pleasant obese AA male appears to be in NAD, Well developed, Well nourished, alert and cooperative Respiratory: Respirations even and unlabored. Lungs clear to auscultation bilaterally.   No wheezes, crackles, or rhonchi.  Cardiovascular: Normal S1, S2. No MRG. Regular rate and rhythm. No peripheral edema, cyanosis or pallor.  Gastrointestinal:  Soft, nondistended, nontender. No rebound or guarding. Normal bowel sounds. No appreciable masses or hepatomegaly. Rectal:  Not performed.  Psychiatric: Demonstrates good judgement and reason without abnormal affect or behaviors.  RELEVANT LABS AND IMAGING: CBC    Component Value Date/Time   WBC 4.7 09/08/2019 1138   RBC 3.67 (L) 09/08/2019 1138   HGB 11.8 (L) 09/08/2019 1138   HCT 35.6 (L) 09/08/2019 1138   PLT 174.0 09/08/2019 1138   MCV 97.0 09/08/2019 1138   MCH 32.0 12/07/2011 1511   MCHC 33.0 09/08/2019 1138   RDW 12.9 09/08/2019 1138   LYMPHSABS 1.3 09/08/2019 1138   MONOABS 0.4 09/08/2019 1138   EOSABS 0.2 09/08/2019 1138   BASOSABS 0.1 09/08/2019 1138    CMP     Component Value Date/Time   NA 137 09/08/2019 1138   K 4.4 09/08/2019 1138   CL 108 09/08/2019 1138   CO2 20 09/08/2019 1138   GLUCOSE 152 (H)  09/08/2019 1138   BUN 50 (H) 09/08/2019 1138   CREATININE 3.64 (H) 09/08/2019 1138   CREATININE 1.90 (H) 12/07/2011 1511   CALCIUM 9.4 09/08/2019 1138   PROT 7.4 09/08/2019 1138   ALBUMIN 4.2 09/08/2019 1138   AST 18 09/08/2019 1138   ALT 22 09/08/2019 1138   ALKPHOS 69 09/08/2019 1138   BILITOT 0.5 09/08/2019 1138   GFRNONAA 39 (L) 01/16/2008 0515   GFRAA (L) 01/16/2008 0515  48        The eGFR has been calculated using the MDRD equation. This calculation has not been validated in all clinical    Assessment: 1.  Elevated amylase and lipase: Can be elevated in renal failure, recent CT showed an abnormality in the pancreas, did raise question of a possible mass in the scapula 2.  Abnormal ultrasound of the abdomen: Showing a possible mass in the scapula in a patient with history of prostate and renal cancer  Plan: 1.  Repeated amylase and lipase today. 2.  Discussed recent CT scan showing possible met in patient's scapula.  This could be from his previous prostate or renal cancer.  Discussed this in depth with the patient.  Answered all of his questions. 3.  Explained that amylase and lipase can be elevated in conditions that are not occurring in the pancreas, for example in renal failure which could be the cause or could be idiopathic.  Will discuss any further testing if necessary with Dr. Tarri Glenn. 4.  Patient should keep appointments for imaging upcoming with his oncology team. 5.  Patient to follow with [place] per recommendations after labs above.  Ellouise Newer, PA-C [facility] 10/01/2019, 1:45 PM  Cc: Sonia Side., FNP

## 2019-10-01 NOTE — Patient Instructions (Addendum)
If you are age 72 or older, your body mass index should be between 23-30. Your Body mass index is 39.38 kg/m. If this is out of the aforementioned range listed, please consider follow up with your Primary Care Provider.  If you are age 48 or younger, your body mass index should be between 19-25. Your Body mass index is 39.38 kg/m. If this is out of the aformentioned range listed, please consider follow up with your Primary Care Provider.   Your provider has requested that you go to the basement level for lab work before leaving today. Press "B" on the elevator. The lab is located at the first door on the left as you exit the elevator.

## 2019-10-03 NOTE — Progress Notes (Signed)
Reviewed and agree with management plans. I recommend repeating lipase and amylase in 3 months. If elevation remains predominantly amylase, proceed with urine amylase at that time. Consider repeat imaging of the pancreas with recurrent abdominal pain as the recent CT was a non-contrasted study in the setting of renal insufficiency.   Kody Vigil L. Tarri Glenn, MD, MPH

## 2019-10-06 ENCOUNTER — Other Ambulatory Visit: Payer: Self-pay

## 2019-10-06 ENCOUNTER — Ambulatory Visit (HOSPITAL_COMMUNITY)
Admission: RE | Admit: 2019-10-06 | Discharge: 2019-10-06 | Disposition: A | Discharge status: Home / Self Care | Payer: Medicare HMO | Source: Ambulatory Visit | Attending: Family | Admitting: Family

## 2019-10-06 DIAGNOSIS — G250 Essential tremor: Secondary | ICD-10-CM | POA: Diagnosis not present

## 2019-10-09 ENCOUNTER — Other Ambulatory Visit: Payer: Self-pay

## 2019-10-09 ENCOUNTER — Ambulatory Visit (HOSPITAL_COMMUNITY): Payer: Medicare HMO | Attending: Cardiology

## 2019-10-09 DIAGNOSIS — I10 Essential (primary) hypertension: Secondary | ICD-10-CM | POA: Insufficient documentation

## 2019-10-09 MED ORDER — PERFLUTREN LIPID MICROSPHERE
1.0000 mL | INTRAVENOUS | Status: AC | PRN
  Administered 2019-10-09: 2 mL via INTRAVENOUS

## 2019-10-11 ENCOUNTER — Other Ambulatory Visit: Payer: Self-pay

## 2019-10-11 ENCOUNTER — Ambulatory Visit (HOSPITAL_COMMUNITY)
Admission: RE | Admit: 2019-10-11 | Discharge: 2019-10-11 | Disposition: A | Discharge status: Home / Self Care | Payer: Medicare HMO | Source: Ambulatory Visit | Attending: Family | Admitting: Family

## 2019-10-11 DIAGNOSIS — Z8546 Personal history of malignant neoplasm of prostate: Secondary | ICD-10-CM | POA: Diagnosis not present

## 2019-12-10 ENCOUNTER — Telehealth: Payer: Self-pay | Admitting: Oncology

## 2019-12-10 NOTE — Telephone Encounter (Signed)
Received a new pt referral from [facility] for bone mets. I cld and spoke to [facility] at the referring office and informed her Dr. Alen Blew felt that the pt needs to be referred back to [facility] where he had been seen previously.

## 2019-12-29 ENCOUNTER — Other Ambulatory Visit: Payer: Self-pay

## 2019-12-29 DIAGNOSIS — R748 Abnormal levels of other serum enzymes: Secondary | ICD-10-CM

## 2019-12-29 NOTE — Progress Notes (Signed)
lipase

## 2020-01-02 ENCOUNTER — Telehealth: Payer: Self-pay | Admitting: Physician Assistant

## 2020-01-02 ENCOUNTER — Other Ambulatory Visit (INDEPENDENT_AMBULATORY_CARE_PROVIDER_SITE_OTHER): Payer: Medicare HMO

## 2020-01-02 DIAGNOSIS — R748 Abnormal levels of other serum enzymes: Secondary | ICD-10-CM | POA: Diagnosis not present

## 2020-01-02 LAB — LIPASE: Lipase: 84 U/L — ABNORMAL HIGH (ref 11.0–59.0)

## 2020-01-02 LAB — AMYLASE: Amylase: 508 U/L — ABNORMAL HIGH (ref 27–131)

## 2020-01-02 NOTE — Telephone Encounter (Signed)
The pt was advised that he needs to come in for follow up labs per Dr Tarri Glenn to evaluate the pancreas.  Pt verbalized understanding

## 2020-01-02 NOTE — Telephone Encounter (Signed)
Patient called states he received a letter to go for labs but he does not know for what. Tried to explain but he was not understanding please advise.

## 2020-01-28 ENCOUNTER — Ambulatory Visit: Payer: Medicare HMO | Admitting: Gastroenterology

## 2020-04-16 ENCOUNTER — Other Ambulatory Visit: Payer: Self-pay | Admitting: Nephrology

## 2020-04-16 DIAGNOSIS — N184 Chronic kidney disease, stage 4 (severe): Secondary | ICD-10-CM

## 2020-04-19 ENCOUNTER — Ambulatory Visit
Admission: RE | Admit: 2020-04-19 | Discharge: 2020-04-19 | Disposition: A | Discharge status: Home / Self Care | Payer: Medicare HMO | Source: Ambulatory Visit | Attending: Nephrology | Admitting: Nephrology

## 2020-04-19 DIAGNOSIS — N184 Chronic kidney disease, stage 4 (severe): Secondary | ICD-10-CM

## 2020-04-22 ENCOUNTER — Other Ambulatory Visit (HOSPITAL_COMMUNITY): Payer: Self-pay | Admitting: Nephrology

## 2020-04-22 ENCOUNTER — Other Ambulatory Visit: Payer: Self-pay | Admitting: Nephrology

## 2020-04-22 DIAGNOSIS — C649 Malignant neoplasm of unspecified kidney, except renal pelvis: Secondary | ICD-10-CM

## 2020-04-28 ENCOUNTER — Other Ambulatory Visit: Payer: Self-pay | Admitting: Nephrology

## 2020-04-28 ENCOUNTER — Ambulatory Visit (HOSPITAL_COMMUNITY): Admission: RE | Admit: 2020-04-28 | Payer: Medicare HMO | Source: Ambulatory Visit

## 2020-04-30 ENCOUNTER — Ambulatory Visit (HOSPITAL_COMMUNITY): Payer: Medicare HMO

## 2020-05-04 ENCOUNTER — Other Ambulatory Visit: Payer: Self-pay

## 2020-05-04 ENCOUNTER — Ambulatory Visit (HOSPITAL_COMMUNITY)
Admission: RE | Admit: 2020-05-04 | Discharge: 2020-05-04 | Disposition: A | Discharge status: Home / Self Care | Payer: Medicare HMO | Source: Ambulatory Visit | Attending: Nephrology | Admitting: Nephrology

## 2020-05-04 DIAGNOSIS — C649 Malignant neoplasm of unspecified kidney, except renal pelvis: Secondary | ICD-10-CM

## 2020-05-04 MED ORDER — GADOBUTROL 1 MMOL/ML IV SOLN
10.0000 mL | Freq: Once | INTRAVENOUS | Status: AC | PRN
  Administered 2020-05-04: 10 mL via INTRAVENOUS

## 2020-12-27 ENCOUNTER — Other Ambulatory Visit: Payer: Self-pay | Admitting: Family

## 2020-12-27 DIAGNOSIS — R748 Abnormal levels of other serum enzymes: Secondary | ICD-10-CM

## 2021-01-13 ENCOUNTER — Other Ambulatory Visit: Payer: Medicare HMO

## 2021-02-18 ENCOUNTER — Telehealth: Payer: Self-pay

## 2021-02-18 NOTE — Telephone Encounter (Signed)
Called the office of [employer] with [employer]. I asked if someone could fax to our office patient's last office note, EKG, recent labs and reason for referral to [phone](Fax). Junie Panning stated that she will notate the patient's chart with the needed information request and have medical records or the nurse fax it over. She asked when and/if patient was informed her he is scheduled for 03/16/21 with Dr. Gardiner Rhyme.

## 2021-02-22 LAB — COLOGUARD: COLOGUARD: NEGATIVE

## 2021-03-13 NOTE — Progress Notes (Deleted)
Cardiology Office Note:    Date:  03/13/2021   ID:  Paulla Fore, DOB 1945/09/11, MRN [id number]  PCP:  Sonia Side., FNP  Cardiologist:  None  Electrophysiologist:  None   Referring MD: Sonia Side., FNP   No chief complaint on file. ***  History of Present Illness:    [name] is a 75 y.o. male with a hx of hypertension, prostate cancer, renal cancer status post right nephrectomy, hyperlipidemia, CKD who is referred by Dustin Folks, NP for evaluation of chest pain.  Past Medical History:  Diagnosis Date   Allergy    Cancer ([place])    prostate /renal ca   GERD (gastroesophageal reflux disease)    Hyperlipidemia    Renal insufficiency     Past Surgical History:  Procedure Laterality Date   COLONOSCOPY  04/17/12   Poor prep   COLONOSCOPY  12/20/12   Repeat, 1 Polyp - needs 46yr f/u   FOOT SURGERY     NEPHRECTOMY      Current Medications: No outpatient medications have been marked as taking for the 03/16/21 encounter (Appointment) with Donato Heinz, MD.     Allergies:   Patient has no known allergies.   Social History   Socioeconomic History   Marital status: Single    Spouse name: Not on file   Number of children: Not on file   Years of education: Not on file   Highest education level: Not on file  Occupational History   Occupation: retired  Tobacco Use   Smoking status: Never   Smokeless tobacco: Never  Vaping Use   Vaping Use: Never used  Substance and Sexual Activity   Alcohol use: Never   Drug use: Never   Sexual activity: Not on file  Other Topics Concern   Not on file  Social History Narrative   Not on file   Social Determinants of Health   Financial Resource Strain: Not on file  Food Insecurity: Not on file  Transportation Needs: Not on file  Physical Activity: Not on file  Stress: Not on file  Social Connections: Not on file     Family History: The patient's ***family history includes Asthma in his  mother; Cancer in his maternal grandfather and maternal grandmother.  ROS:   Please see the history of present illness.    *** All other systems reviewed and are negative.  EKGs/Labs/Other Studies Reviewed:    The following studies were reviewed today: ***  EKG:  EKG is *** ordered today.  The ekg ordered today demonstrates ***  Recent Labs: No results found for requested labs within last 8760 hours.  Recent Lipid Panel    Component Value Date/Time   LDLDIRECT 206 (H) 02/26/2013 1112    Physical Exam:    VS:  There were no vitals taken for this visit.    Wt Readings from Last 3 Encounters:  10/01/19 290 lb 6 oz (131.7 kg)  09/08/19 290 lb (131.5 kg)  04/16/13 268 lb 12.8 oz (121.9 kg)     GEN: *** Well nourished, well developed in no acute distress HEENT: Normal NECK: No JVD; No carotid bruits LYMPHATICS: No lymphadenopathy CARDIAC: ***RRR, no murmurs, rubs, gallops RESPIRATORY:  Clear to auscultation without rales, wheezing or rhonchi  ABDOMEN: Soft, non-tender, non-distended MUSCULOSKELETAL:  No edema; No deformity  SKIN: Warm and dry NEUROLOGIC:  Alert and oriented x 3 PSYCHIATRIC:  Normal affect   ASSESSMENT:    No diagnosis found.  PLAN:    In order of problems listed above:  ***   Medication Adjustments/Labs and Tests Ordered: Current medicines are reviewed at length with the patient today.  Concerns regarding medicines are outlined above.  No orders of the defined types were placed in this encounter.  No orders of the defined types were placed in this encounter.   There are no Patient Instructions on file for this visit.   Signed, Donato Heinz, MD  03/13/2021 2:34 PM    [place]

## 2021-03-14 NOTE — Progress Notes (Signed)
Cardiology Office Note:    Date:  03/16/2021   ID:  [name], DOB 26-Feb-1946, MRN [id number]  PCP:  Sonia Side., FNP  Cardiologist:  None  Electrophysiologist:  None   Referring MD: Sonia Side., FNP   Chief Complaint  Patient presents with   New Patient (Initial Visit)    CAD, CHF   Chest Pain   Edema    History of Present Illness:    [name] is a 75 y.o. male with a hx of hypertension, prostate cancer, renal cancer status post right nephrectomy, hyperlipidemia, CKD stage IV who is referred by Dustin Folks, NP for evaluation of chest pain.  Since last clinic visit, he was referred by Dr. Tamala Julian for his persistent chest pain. He describes the pain as dull and rates the pain less than 1/10. He also has back pain and LE edema.  Since last Tuesday, he has been coughing up phlegm however the cough is improving. He currently is not active however he works on his yard and house with no complications. Denies shortness of breath, lightheadedness, or palpitations. He had a nephrectomy around 15 years ago. Three toes are removed from his right foot from a lawn mower accident. He does not smoke.  No known family history heart disease.   Past Medical History:  Diagnosis Date   Allergy    Cancer ([place])    prostate /renal ca   GERD (gastroesophageal reflux disease)    Hyperlipidemia    Renal insufficiency     Past Surgical History:  Procedure Laterality Date   COLONOSCOPY  04/17/12   Poor prep   COLONOSCOPY  12/20/12   Repeat, 1 Polyp - needs 11yr f/u   FOOT SURGERY     NEPHRECTOMY      Current Medications: Current Meds  Medication Sig   amLODipine (NORVASC) 10 MG tablet Take 1 tablet by mouth daily.   atenolol (TENORMIN) 25 MG tablet Take 1 tablet (25 mg total) by mouth daily.   Cholecalciferol (VITAMIN D) 125 MCG (5000 UT) CAPS Take 5,000 Units by mouth daily.    omeprazole (PRILOSEC) 20 MG capsule Take 20 mg by mouth 2 (two) times daily before a meal.    Vitamin Mixture (VITAMIN E COMPLETE PO) Take 2 capsules by mouth daily.     Allergies:   Patient has no known allergies.   Social History   Socioeconomic History   Marital status: Single    Spouse name: Not on file   Number of children: Not on file   Years of education: Not on file   Highest education level: Not on file  Occupational History   Occupation: retired  Tobacco Use   Smoking status: Never   Smokeless tobacco: Never  Vaping Use   Vaping Use: Never used  Substance and Sexual Activity   Alcohol use: Never   Drug use: Never   Sexual activity: Not on file  Other Topics Concern   Not on file  Social History Narrative   Not on file   Social Determinants of Health   Financial Resource Strain: Not on file  Food Insecurity: Not on file  Transportation Needs: Not on file  Physical Activity: Not on file  Stress: Not on file  Social Connections: Not on file     Family History: The patient's family history includes Asthma in his mother; Cancer in his maternal grandfather and maternal grandmother.  ROS:   Please see the history of present illness.    (+)  cough (+) chest pain  (+) LE edema  (+) back pain  All other systems reviewed and are negative.  EKGs/Labs/Other Studies Reviewed:    The following studies were reviewed today: ECHO 04/07/2020:  IMPRESSIONS    1. Definity used; normal LV systolic function; mild LVH.   2. Left ventricular ejection fraction, by estimation, is 65 to 70%. The  left ventricle has normal function. The left ventrical has no regional  wall motion abnormalities. There is mildly increased left ventricular  hypertrophy. Left ventricular diastolic  parameters were normal.   3. Right ventricular systolic function is normal. The right ventricular  size is normal.   4. The mitral valve is normal in structure and function. no evidence of  mitral valve regurgitation. No evidence of mitral stenosis.   5. The aortic valve is tricuspid.  Aortic valve regurgitation is not  visualized. No aortic stenosis is present.   6. The inferior vena cava is normal in size with greater than 50%  respiratory variability, suggesting right atrial pressure of 3 mmHg.  EKG:  03/16/2021: sinus rhythm, rate 77 bpm, no ST abnormalities, left axis deviation  Recent Labs: No results found for requested labs within last 8760 hours.  Recent Lipid Panel    Component Value Date/Time   LDLDIRECT 206 (H) 02/26/2013 1112    Physical Exam:    VS:  BP (!) 122/58 (BP Location: Left Arm, Patient Position: Sitting, Cuff Size: Large)   Pulse 77   Ht 6' (1.829 m)   Wt 228 lb (103.4 kg)   BMI 30.92 kg/m     Wt Readings from Last 3 Encounters:  03/16/21 228 lb (103.4 kg)  10/01/19 290 lb 6 oz (131.7 kg)  09/08/19 290 lb (131.5 kg)     GEN:  Well nourished, well developed in no acute distress HEENT: Normal NECK: No JVD; No carotid bruits LYMPHATICS: No lymphadenopathy CARDIAC: RRR, no murmurs, rubs, gallops RESPIRATORY:  Clear to auscultation without rales, wheezing or rhonchi  ABDOMEN: Soft, non-tender, non-distended MUSCULOSKELETAL:  trace edema; No deformity  SKIN: Warm and dry NEUROLOGIC:  Alert and oriented x 3 PSYCHIATRIC:  Normal affect   ASSESSMENT:    1. Chest pain of uncertain etiology   2. Essential hypertension   3. Hyperlipidemia, unspecified hyperlipidemia type   4. Chronic kidney disease (CKD), stage IV (severe) (HCC)    PLAN:    Chest pain: Atypical in description but does have significant CAD risk factors (age, hypertension, hyperlipidemia, T2DM).  Overall would classify as intermediate risk for obstructive CAD and further evaluation warranted -Lexiscan Myoview.  Plan medical management unless high risk given his CKD stage IV -Echocardiogram  Hypertension: On hydralazine 100 mg twice daily and Imdur 10 mg daily and propranolol 60 mg daily.  Appears controlled  Hyperlipidemia: On atorvastatin 40 mg daily.  LDL 99 on  12/17/2020.  Will check calcium score to guide how aggressive to be in lowering cholesterol  CKD stage IV: Follows with nephrology.  Creatinine 3.7 on 10/29/2020  T2DM: Well-controlled, most recent A1c down to 5.3  RTC in 3 months  Shared Decision Making/Informed Consent The risks [chest pain, shortness of breath, cardiac arrhythmias, dizziness, blood pressure fluctuations, myocardial infarction, stroke/transient ischemic attack, nausea, vomiting, allergic reaction, radiation exposure, metallic taste sensation and life-threatening complications (estimated to be 1 in 10,000)], benefits (risk stratification, diagnosing coronary artery disease, treatment guidance) and alternatives of a nuclear stress test were discussed in detail with Mr. Wieck and he agrees to proceed.  Medication Adjustments/Labs and Tests Ordered: Current medicines are reviewed at length with the patient today.  Concerns regarding medicines are outlined above.  No orders of the defined types were placed in this encounter.  No orders of the defined types were placed in this encounter.   There are no Patient Instructions on file for this visit.    I,Jada Bradford,acting as a Education administrator for Donato Heinz, MD.,have documented all relevant documentation on the behalf of Donato Heinz, MD,as directed by  Donato Heinz, MD while in the presence of Donato Heinz, MD.  I, Donato Heinz, MD, have reviewed all documentation for this visit. The documentation on 03/16/21 for the exam, diagnosis, procedures, and orders are all accurate and complete.   Signed, Donato Heinz, MD  03/16/2021 10:17 AM    [place]

## 2021-03-16 ENCOUNTER — Ambulatory Visit (INDEPENDENT_AMBULATORY_CARE_PROVIDER_SITE_OTHER): Payer: Medicare HMO | Admitting: Cardiology

## 2021-03-16 ENCOUNTER — Other Ambulatory Visit: Payer: Self-pay

## 2021-03-16 ENCOUNTER — Encounter: Payer: Self-pay | Admitting: Cardiology

## 2021-03-16 VITALS — BP 122/58 | HR 77 | Ht 72.0 in | Wt 228.0 lb

## 2021-03-16 DIAGNOSIS — N184 Chronic kidney disease, stage 4 (severe): Secondary | ICD-10-CM | POA: Diagnosis not present

## 2021-03-16 DIAGNOSIS — R079 Chest pain, unspecified: Secondary | ICD-10-CM | POA: Diagnosis not present

## 2021-03-16 DIAGNOSIS — E785 Hyperlipidemia, unspecified: Secondary | ICD-10-CM | POA: Diagnosis not present

## 2021-03-16 DIAGNOSIS — I10 Essential (primary) hypertension: Secondary | ICD-10-CM

## 2021-03-16 NOTE — Patient Instructions (Signed)
Medication Instructions:  Your physician recommends that you continue on your current medications as directed. Please refer to the Current Medication list given to you today.  *If you need a refill on your cardiac medications before your next appointment, please call your pharmacy*  Testing/Procedures: Your physician has requested that you have an echocardiogram. Echocardiography is a painless test that uses sound waves to create images of your heart. It provides your doctor with information about the size and shape of your heart and how well your heart's chambers and valves are working. This procedure takes approximately one hour. There are no restrictions for this procedure.  Your physician has requested that you have a Lexicographer (at [facility]). For further information please visit [email or web address]. Please follow instruction sheet, as given.   How to prepare for your Myocardial Perfusion Test: Do not eat or drink 3 hours prior to your test, except you may have water. Do not consume products containing caffeine (regular or decaffeinated) 12 hours prior to your test. (ex: coffee, chocolate, sodas, tea). Do bring a list of your current medications with you.  If not listed below, you may take your medications as normal. Do wear comfortable clothes (no dresses or overalls) and walking shoes, tennis shoes preferred (No heels or open toe shoes are allowed). Do NOT wear cologne, perfume, aftershave, or lotions (deodorant is allowed). The test will take approximately 3 to 4 hours to complete If these instructions are not followed, your test will have to be rescheduled.  CT coronary calcium score. This test is done at [address] [facility] [address]. This is $99 out of pocket.   Coronary CalciumScan A coronary calcium scan is an imaging test used to look for deposits of calcium and other fatty materials (plaques) in the inner lining of the blood vessels of the heart (coronary arteries).  These deposits of calcium and plaques can partly clog and narrow the coronary arteries without producing any symptoms or warning signs. This puts a person at risk for a heart attack. This test can detect these deposits before symptoms develop. Tell a health care provider about: Any allergies you have. All medicines you are taking, including vitamins, herbs, eye drops, creams, and over-the-counter medicines. Any problems you or family members have had with anesthetic medicines. Any blood disorders you have. Any surgeries you have had. Any medical conditions you have. Whether you are pregnant or may be pregnant. What are the risks? Generally, this is a safe procedure. However, problems may occur, including: Harm to a pregnant woman and her unborn baby. This test involves the use of radiation. Radiation exposure can be dangerous to a pregnant woman and her unborn baby. If you are pregnant, you generally should not have this procedure done. Slight increase in the risk of cancer. This is because of the radiation involved in the test. What happens before the procedure? No preparation is needed for this procedure. What happens during the procedure? You will undress and remove any jewelry around your neck or chest. You will put on a hospital gown. Sticky electrodes will be placed on your chest. The electrodes will be connected to an electrocardiogram (ECG) machine to record a tracing of the electrical activity of your heart. A CT scanner will take pictures of your heart. During this time, you will be asked to lie still and hold your breath for 2-3 seconds while a picture of your heart is being taken. The procedure may vary among health care providers and hospitals. What  happens after the procedure? You can get dressed. You can return to your normal activities. It is up to you to get the results of your test. Ask your health care provider, or the department that is doing the test, when your results  will be ready. Summary A coronary calcium scan is an imaging test used to look for deposits of calcium and other fatty materials (plaques) in the inner lining of the blood vessels of the heart (coronary arteries). Generally, this is a safe procedure. Tell your health care provider if you are pregnant or may be pregnant. No preparation is needed for this procedure. A CT scanner will take pictures of your heart. You can return to your normal activities after the scan is done. This information is not intended to replace advice given to you by your health care provider. Make sure you discuss any questions you have with your health care provider. Document Released: 02/10/2008 Document Revised: 07/03/2016 Document Reviewed: 07/03/2016 Elsevier Interactive Patient Education  2017 [place]: At [facility], you and your health needs are our priority.  As part of our continuing mission to provide you with exceptional heart care, we have created designated Provider Care Teams.  These Care Teams include your primary Cardiologist (physician) and Advanced Practice Providers (APPs -  Physician Assistants and Nurse Practitioners) who all work together to provide you with the care you need, when you need it.  We recommend signing up for the patient portal called "MyChart".  Sign up information is provided on this After Visit Summary.  MyChart is used to connect with patients for Virtual Visits (Telemedicine).  Patients are able to view lab/test results, encounter notes, upcoming appointments, etc.  Non-urgent messages can be sent to your provider as well.   To learn more about what you can do with MyChart, go to [email or web address].    Your next appointment:   3 month(s)  The format for your next appointment:   In Person  Provider:   Oswaldo Milian, MD

## 2021-03-17 ENCOUNTER — Ambulatory Visit
Admission: RE | Admit: 2021-03-17 | Discharge: 2021-03-17 | Disposition: A | Discharge status: Home / Self Care | Payer: Medicare HMO | Source: Ambulatory Visit | Attending: Family | Admitting: Family

## 2021-03-17 DIAGNOSIS — R748 Abnormal levels of other serum enzymes: Secondary | ICD-10-CM

## 2021-03-21 ENCOUNTER — Telehealth (HOSPITAL_COMMUNITY): Payer: Self-pay

## 2021-03-21 NOTE — Telephone Encounter (Signed)
Attempted to contact the patient, no answer. Will try again later. S.Willams EMTP

## 2021-03-24 ENCOUNTER — Other Ambulatory Visit: Payer: Self-pay

## 2021-03-24 ENCOUNTER — Ambulatory Visit (HOSPITAL_COMMUNITY): Payer: Medicare HMO | Attending: Cardiovascular Disease

## 2021-03-24 DIAGNOSIS — R079 Chest pain, unspecified: Secondary | ICD-10-CM

## 2021-03-24 LAB — MYOCARDIAL PERFUSION IMAGING
LV dias vol: 98 mL (ref 62–150)
LV sys vol: 33 mL
Peak HR: 71 {beats}/min
Rest HR: 60 {beats}/min
SDS: 1
SRS: 1
SSS: 2
TID: 0.79

## 2021-03-24 MED ORDER — TECHNETIUM TC 99M TETROFOSMIN IV KIT
10.7000 | PACK | Freq: Once | INTRAVENOUS | Status: AC | PRN
  Administered 2021-03-24: 10.7 via INTRAVENOUS
  Filled 2021-03-24: qty 11

## 2021-03-24 MED ORDER — TECHNETIUM TC 99M TETROFOSMIN IV KIT
30.7000 | PACK | Freq: Once | INTRAVENOUS | Status: AC | PRN
  Administered 2021-03-24: 30.7 via INTRAVENOUS
  Filled 2021-03-24: qty 31

## 2021-03-24 MED ORDER — REGADENOSON 0.4 MG/5ML IV SOLN
0.4000 mg | Freq: Once | INTRAVENOUS | Status: AC
Start: 2021-03-24 — End: 2021-03-24
  Administered 2021-03-24: 0.4 mg via INTRAVENOUS

## 2021-04-18 ENCOUNTER — Ambulatory Visit (HOSPITAL_COMMUNITY): Payer: Medicare HMO | Attending: Cardiology

## 2021-04-18 ENCOUNTER — Ambulatory Visit (INDEPENDENT_AMBULATORY_CARE_PROVIDER_SITE_OTHER)
Admission: RE | Admit: 2021-04-18 | Discharge: 2021-04-18 | Disposition: A | Discharge status: Home / Self Care | Payer: Self-pay | Source: Ambulatory Visit | Attending: Cardiology | Admitting: Cardiology

## 2021-04-18 ENCOUNTER — Other Ambulatory Visit: Payer: Self-pay

## 2021-04-18 DIAGNOSIS — R079 Chest pain, unspecified: Secondary | ICD-10-CM | POA: Diagnosis not present

## 2021-04-18 DIAGNOSIS — I10 Essential (primary) hypertension: Secondary | ICD-10-CM

## 2021-04-18 LAB — ECHOCARDIOGRAM COMPLETE
Area-P 1/2: 4.89 cm2
S' Lateral: 3.4 cm

## 2021-04-18 MED ORDER — PERFLUTREN LIPID MICROSPHERE
1.0000 mL | INTRAVENOUS | Status: AC | PRN
  Administered 2021-04-18: 2 mL via INTRAVENOUS

## 2021-04-19 ENCOUNTER — Telehealth: Payer: Self-pay | Admitting: Cardiology

## 2021-04-19 NOTE — Telephone Encounter (Signed)
Jacob Heinz, MD  04/19/2021  6:53 AM EDT     No calcified plaque in heart arteries   Does have small pulmonary nodule, recommend repeat chest CT in 1 year.  Also with pleural plaque, would recommend PCP follow-up to determine if asbestos exposure     Jacob Heinz, MD  04/19/2021  6:48 AM EDT     No significant abnormalities    Spoke to patient-aware of results and verbalized understanding.  Results forwarded to PCP for follow up.

## 2021-04-19 NOTE — Telephone Encounter (Signed)
[name] is calling requesting his Echo & CT results. Please advise.

## 2021-04-20 ENCOUNTER — Telehealth: Payer: Self-pay | Admitting: Cardiology

## 2021-04-20 ENCOUNTER — Other Ambulatory Visit: Payer: Self-pay | Admitting: *Deleted

## 2021-04-20 DIAGNOSIS — R911 Solitary pulmonary nodule: Secondary | ICD-10-CM

## 2021-04-20 NOTE — Telephone Encounter (Signed)
Pt is wanting a copy of his test done on 04/18/21 please advise

## 2021-04-20 NOTE — Telephone Encounter (Signed)
Pt is requesting a copy of his calcium score and echo results to be mailed to his address on file.  Will route this message to Dr. Newman Nickels covering RN to make her aware of this, so results can be mailed to the pt as requested.

## 2021-04-20 NOTE — Telephone Encounter (Signed)
Results printed and mailed to patient

## 2021-05-09 ENCOUNTER — Other Ambulatory Visit: Payer: Medicare HMO

## 2021-05-11 ENCOUNTER — Institutional Professional Consult (permissible substitution): Payer: Medicare HMO | Admitting: Emergency Medicine

## 2021-05-19 ENCOUNTER — Ambulatory Visit: Payer: Medicare HMO | Admitting: Pulmonary Disease

## 2021-05-19 ENCOUNTER — Other Ambulatory Visit: Payer: Self-pay

## 2021-05-19 ENCOUNTER — Encounter: Payer: Self-pay | Admitting: Pulmonary Disease

## 2021-05-19 VITALS — BP 124/64 | HR 85 | Ht 72.0 in | Wt 226.0 lb

## 2021-05-19 DIAGNOSIS — Z85528 Personal history of other malignant neoplasm of kidney: Secondary | ICD-10-CM | POA: Diagnosis not present

## 2021-05-19 DIAGNOSIS — Z905 Acquired absence of kidney: Secondary | ICD-10-CM

## 2021-05-19 DIAGNOSIS — R911 Solitary pulmonary nodule: Secondary | ICD-10-CM | POA: Diagnosis not present

## 2021-05-19 DIAGNOSIS — Z8546 Personal history of malignant neoplasm of prostate: Secondary | ICD-10-CM

## 2021-05-19 DIAGNOSIS — J920 Pleural plaque with presence of asbestos: Secondary | ICD-10-CM | POA: Diagnosis not present

## 2021-05-19 NOTE — Patient Instructions (Addendum)
Thank you for visiting Dr. Valeta Harms at [facility]. Today we recommend the following:  Orders Placed This Encounter  Procedures   CT Chest Wo Contrast   Please schedule follow up with [place] in 1 year after CT chest   Return in about 1 year (around 05/19/2022) for with APP.    Please do your part to reduce the spread of COVID-19.

## 2021-05-19 NOTE — Progress Notes (Signed)
Synopsis: Referred in Sept 2022 for lung nodule by Sonia Side., FNP  Subjective:   PATIENT ID: [name] GENDER: male DOB: 12-06-1945, MRN: [id number]  Chief Complaint  Patient presents with   Consult    For lung nodules    This is a 75 year old gentleman, past medical history of Prostate cancer diagnosed in 2014, renal cancer status post right nephrectomy in 2007.  Patient had an incidentally found a small 3 mm lung nodule.  He is a lifelong non-smoker.  No family history of lung cancer.  Patient was referred for evaluation of a small 3 mm subpleural lesion found on the right lung.  We discussed this today in the office and reviewed images.  He does also have cough with some congestion.  He attributes this to allergies and has had relief with antihistamines.  He is currently not taking his Allegra.   Past Medical History:  Diagnosis Date   Allergy    Cancer ([place])    prostate /renal ca   GERD (gastroesophageal reflux disease)    Hyperlipidemia    Renal insufficiency      Family History  Problem Relation Age of Onset   Asthma Mother    Cancer Maternal Grandmother    Cancer Maternal Grandfather      Past Surgical History:  Procedure Laterality Date   COLONOSCOPY  04/17/12   Poor prep   COLONOSCOPY  12/20/12   Repeat, 1 Polyp - needs 98yr f/u   FOOT SURGERY     NEPHRECTOMY      Social History   Socioeconomic History   Marital status: Single    Spouse name: Not on file   Number of children: Not on file   Years of education: Not on file   Highest education level: Not on file  Occupational History   Occupation: retired  Tobacco Use   Smoking status: Never   Smokeless tobacco: Never  Vaping Use   Vaping Use: Never used  Substance and Sexual Activity   Alcohol use: Never   Drug use: Never   Sexual activity: Not on file  Other Topics Concern   Not on file  Social History Narrative   Not on file   Social Determinants of Health   Financial  Resource Strain: Not on file  Food Insecurity: Not on file  Transportation Needs: Not on file  Physical Activity: Not on file  Stress: Not on file  Social Connections: Not on file  Intimate Partner Violence: Not on file     No Known Allergies   Outpatient Medications Prior to Visit  Medication Sig Dispense Refill   atorvastatin (LIPITOR) 40 MG tablet Take 40 mg by mouth daily.     famotidine (PEPCID) 20 MG tablet Take 20 mg by mouth 2 (two) times daily.     furosemide (LASIX) 40 MG tablet Take 40 mg by mouth daily.     hydrALAZINE (APRESOLINE) 100 MG tablet Take 100 mg by mouth 2 (two) times daily.     isosorbide mononitrate (IMDUR) 30 MG 24 hr tablet Take 30 mg by mouth daily.     pantoprazole (PROTONIX) 40 MG tablet Take 40 mg by mouth 2 (two) times daily.     propranolol (INDERAL) 60 MG tablet Take 60 mg by mouth daily.     No facility-administered medications prior to visit.    Review of Systems  Constitutional:  Negative for chills, fever, malaise/fatigue and weight loss.  HENT:  Negative for hearing loss,  sore throat and tinnitus.   Eyes:  Negative for blurred vision and double vision.  Respiratory:  Positive for cough and sputum production. Negative for hemoptysis, shortness of breath, wheezing and stridor.   Cardiovascular:  Negative for chest pain, palpitations, orthopnea, leg swelling and PND.  Gastrointestinal:  Negative for abdominal pain, constipation, diarrhea, heartburn, nausea and vomiting.  Genitourinary:  Negative for dysuria, hematuria and urgency.  Musculoskeletal:  Negative for joint pain and myalgias.  Skin:  Negative for itching and rash.  Neurological:  Negative for dizziness, tingling, weakness and headaches.  Endo/Heme/Allergies:  Negative for environmental allergies. Does not bruise/bleed easily.  Psychiatric/Behavioral:  Negative for depression. The patient is not nervous/anxious and does not have insomnia.   All other systems reviewed and are  negative.   Objective:  Physical Exam Vitals reviewed.  Constitutional:      General: He is not in acute distress.    Appearance: He is well-developed. He is obese.  HENT:     Head: Normocephalic and atraumatic.  Eyes:     General: No scleral icterus.    Conjunctiva/sclera: Conjunctivae normal.     Pupils: Pupils are equal, round, and reactive to light.  Neck:     Vascular: No JVD.     Trachea: No tracheal deviation.  Cardiovascular:     Rate and Rhythm: Normal rate and regular rhythm.     Heart sounds: Normal heart sounds. No murmur heard. Pulmonary:     Effort: Pulmonary effort is normal. No tachypnea, accessory muscle usage or respiratory distress.     Breath sounds: Normal breath sounds. No stridor. No wheezing, rhonchi or rales.  Abdominal:     General: Bowel sounds are normal. There is no distension.     Palpations: Abdomen is soft.     Tenderness: There is no abdominal tenderness.  Musculoskeletal:        General: No tenderness.     Cervical back: Neck supple.  Lymphadenopathy:     Cervical: No cervical adenopathy.  Skin:    General: Skin is warm and dry.     Capillary Refill: Capillary refill takes less than 2 seconds.     Findings: No rash.  Neurological:     Mental Status: He is alert and oriented to person, place, and time.  Psychiatric:        Behavior: Behavior normal.     Vitals:   05/19/21 0923  BP: 124/64  Pulse: 85  SpO2: 99%  Weight: 226 lb (102.5 kg)  Height: 6' (1.829 m)   99% on  RA BMI Readings from Last 3 Encounters:  05/19/21 30.65 kg/m  03/24/21 30.92 kg/m  03/16/21 30.92 kg/m   Wt Readings from Last 3 Encounters:  05/19/21 226 lb (102.5 kg)  03/24/21 228 lb (103.4 kg)  03/16/21 228 lb (103.4 kg)     CBC    Component Value Date/Time   WBC 4.7 09/08/2019 1138   RBC 3.67 (L) 09/08/2019 1138   HGB 11.8 (L) 09/08/2019 1138   HCT 35.6 (L) 09/08/2019 1138   PLT 174.0 09/08/2019 1138   MCV 97.0 09/08/2019 1138   MCH 32.0  12/07/2011 1511   MCHC 33.0 09/08/2019 1138   RDW 12.9 09/08/2019 1138   LYMPHSABS 1.3 09/08/2019 1138   MONOABS 0.4 09/08/2019 1138   EOSABS 0.2 09/08/2019 1138   BASOSABS 0.1 09/08/2019 1138      Chest Imaging: 04/18/2021 cardiac scoring CT 3 mm right upper lobe subpleural pulmonary nodule.  Also has calcified  pleural plaques. The patient's images have been independently reviewed by me.    Pulmonary Functions Testing Results: No flowsheet data found.  FeNO:   Pathology:  Echocardiogram:   Heart Catheterization:     Assessment & Plan:     ICD-10-CM   1. Lung nodule  R91.1 CT Chest Wo Contrast    2. 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 Personal history of renal cancer  Z85.528     4. S/p nephrectomy  Z90.5     5. History of prostate cancer  Z85.46       Discussion:  This is a 75 year old male, non-smoker, calcified pleural plaques, worked as a Furniture conservator/restorer, history of renal cancer status post resection nephrectomy in 2007, prostate cancer.  Referred for a 3 mm right upper lobe subpleural pulmonary nodule.  Plan: We reviewed his images today in the office Patient does have a history of malignancy his renal cell malignancy was many years ago. Also the lesion is very very small at 3 mm Due to his history of malignancy I think it is reasonable to follow-up CT imaging in 1 year.  Patient is a lifelong non-smoker. Have a repeat noncontrasted CT scan of the chest in 12 months. Patient to follow-up with [place] after this. Can see me or APP to review images     Current Outpatient Medications:    atorvastatin (LIPITOR) 40 MG tablet, Take 40 mg by mouth daily., Disp: , Rfl:    famotidine (PEPCID) 20 MG tablet, Take 20 mg by mouth 2 (two) times daily., Disp: , Rfl:    furosemide (LASIX) 40 MG tablet, Take 40 mg by mouth daily., Disp: , Rfl:    hydrALAZINE (APRESOLINE) 100 MG tablet, Take 100 mg by mouth 2 (two) times daily., Disp: , Rfl:     isosorbide mononitrate (IMDUR) 30 MG 24 hr tablet, Take 30 mg by mouth daily., Disp: , Rfl:    pantoprazole (PROTONIX) 40 MG tablet, Take 40 mg by mouth 2 (two) times daily., Disp: , Rfl:    propranolol (INDERAL) 60 MG tablet, Take 60 mg by mouth daily., Disp: , Rfl:    Garner Nash, DO [facility] 05/19/2021 10:07 AM

## 2021-06-13 ENCOUNTER — Ambulatory Visit: Payer: Medicare HMO | Admitting: Cardiology

## 2021-06-23 NOTE — Progress Notes (Deleted)
Cardiology Office Note:    Date:  06/23/2021   ID:  [name], DOB May 26, 1946, MRN [id number]  PCP:  Jacob Side., FNP  Cardiologist:  None  Electrophysiologist:  None   Referring MD: Jacob Side., FNP   No chief complaint on file.   History of Present Illness:    [name] is a 75 y.o. male with a hx of hypertension, prostate cancer, renal cancer status post right nephrectomy, hyperlipidemia, CKD stage IV who presents for follow-up.  He was referred by Jacob Folks, NP for evaluation of chest pain, initially seen on 03/16/2021.  Lexiscan Myoview on 03/24/2021 showed normal perfusion, EF 66%.  Echocardiogram 04/18/2021 showed normal biventricular function, no significant valvular disease  Since last clinic visit,  he was referred by Dr. Tamala [name] for his persistent chest pain. He describes the pain as dull and rates the pain less than 1/10. He also has back pain and LE edema.  Since last Tuesday, he has been coughing up phlegm however the cough is improving. He currently is not active however he works on his yard and house with no complications. Denies shortness of breath, lightheadedness, or palpitations. He had a nephrectomy around 15 years ago. Three toes are removed from his right foot from a lawn mower accident. He does not smoke.  No known family history heart disease.   Past Medical History:  Diagnosis Date   Allergy    Cancer ([place])    prostate /renal ca   GERD (gastroesophageal reflux disease)    Hyperlipidemia    Renal insufficiency     Past Surgical History:  Procedure Laterality Date   COLONOSCOPY  04/17/12   Poor prep   COLONOSCOPY  12/20/12   Repeat, 1 Polyp - needs 56yr f/u   FOOT SURGERY     NEPHRECTOMY      Current Medications: No outpatient medications have been marked as taking for the 06/24/21 encounter (Appointment) with Jacob Heinz, MD.     Allergies:   Patient has no known allergies.   Social History   Socioeconomic  History   Marital status: Single    Spouse name: Not on file   Number of children: Not on file   Years of education: Not on file   Highest education level: Not on file  Occupational History   Occupation: retired  Tobacco Use   Smoking status: Never   Smokeless tobacco: Never  Vaping Use   Vaping Use: Never used  Substance and Sexual Activity   Alcohol use: Never   Drug use: Never   Sexual activity: Not on file  Other Topics Concern   Not on file  Social History Narrative   Not on file   Social Determinants of Health   Financial Resource Strain: Not on file  Food Insecurity: Not on file  Transportation Needs: Not on file  Physical Activity: Not on file  Stress: Not on file  Social Connections: Not on file     Family History: The patient's family history includes Asthma in his mother; Cancer in his maternal grandfather and maternal grandmother.  ROS:   Please see the history of present illness.    (+)cough (+) chest pain  (+) LE edema  (+) back pain  All other systems reviewed and are negative.  EKGs/Labs/Other Studies Reviewed:    The following studies were reviewed today: ECHO 04/07/2020:  IMPRESSIONS    1. Definity used; normal LV systolic function; mild LVH.   2.  Left ventricular ejection fraction, by estimation, is 65 to 70%. The  left ventricle has normal function. The left ventrical has no regional  wall motion abnormalities. There is mildly increased left ventricular  hypertrophy. Left ventricular diastolic  parameters were normal.   3. Right ventricular systolic function is normal. The right ventricular  size is normal.   4. The mitral valve is normal in structure and function. no evidence of  mitral valve regurgitation. No evidence of mitral stenosis.   5. The aortic valve is tricuspid. Aortic valve regurgitation is not  visualized. No aortic stenosis is present.   6. The inferior vena cava is normal in size with greater than 50%  respiratory  variability, suggesting right atrial pressure of 3 mmHg.  EKG:  03/16/2021: sinus rhythm, rate 77 bpm, no ST abnormalities, left axis deviation  Recent Labs: No results found for requested labs within last 8760 hours.  Recent Lipid Panel    Component Value Date/Time   LDLDIRECT 206 (H) 02/26/2013 1112    Physical Exam:    VS:  There were no vitals taken for this visit.    Wt Readings from Last 3 Encounters:  05/19/21 226 lb (102.5 kg)  03/24/21 228 lb (103.4 kg)  03/16/21 228 lb (103.4 kg)     GEN:  Well nourished, well developed in no acute distress HEENT: Normal NECK: No JVD; No carotid bruits LYMPHATICS: No lymphadenopathy CARDIAC: RRR, no murmurs, rubs, gallops RESPIRATORY:  Clear to auscultation without rales, wheezing or rhonchi  ABDOMEN: Soft, non-tender, non-distended MUSCULOSKELETAL:  trace edema; No deformity  SKIN: Warm and dry NEUROLOGIC:  Alert and oriented x 3 PSYCHIATRIC:  Normal affect   ASSESSMENT:    No diagnosis found.  PLAN:    Chest pain: Atypical in description but does have significant CAD risk factors (age, hypertension, hyperlipidemia, T2DM).  Lexiscan Myoview on 03/24/2021 showed normal perfusion, EF 66%.  Echocardiogram 04/18/2021 showed normal biventricular function, no significant valvular disease  Hypertension: On hydralazine 100 mg twice daily and Imdur 10 mg daily and propranolol 60 mg daily.  Appears controlled  Hyperlipidemia: On atorvastatin 40 mg daily.  LDL 99 on 12/17/2020.  Will check calcium score to guide how aggressive to be in lowering cholesterol  CKD stage IV: Follows with nephrology.  Creatinine 3.7 on 10/29/2020  T2DM: Well-controlled, most recent A1c down to 5.3  RTC in ***   Medication Adjustments/Labs and Tests Ordered: Current medicines are reviewed at length with the patient today.  Concerns regarding medicines are outlined above.  No orders of the defined types were placed in this encounter.  No orders of the  defined types were placed in this encounter.   There are no Patient Instructions on file for this visit.      Signed, Jacob Heinz, MD  06/23/2021 11:14 PM    [facility]

## 2021-06-24 ENCOUNTER — Ambulatory Visit: Payer: Medicare HMO | Admitting: Cardiology

## 2021-06-29 ENCOUNTER — Other Ambulatory Visit: Payer: Self-pay

## 2021-06-29 NOTE — Patient Outreach (Signed)
Aging Gracefully Program  06/29/2021  [name] 1946/08/21 [id number]   [facility] Evaluation Interviewer attempted to call patient on today regarding Aging Gracefully referral. No answer from patient after multiple rings. CMA unable to leave confidential voicemail for patient to return call due to mailbox full.  Will attempt to call back within 1 week.   [place] Management Assistant [phone]

## 2021-07-05 ENCOUNTER — Other Ambulatory Visit: Payer: Self-pay

## 2021-07-05 NOTE — Patient Outreach (Signed)
Aging Gracefully Program  07/05/2021  [name] [birth year] [id number]  [facility] Evaluation Interviewer attempted to call patient on today regarding Aging Gracefully referral. No answer from patient after multiple rings. CMA unable to leave confidential voicemail for patient to return call due to mailbox full.  Will attempt to call back within 1 week.   [place] Management Assistant [phone]

## 2021-07-18 ENCOUNTER — Other Ambulatory Visit: Payer: Self-pay

## 2021-07-18 NOTE — Patient Outreach (Signed)
Aging Gracefully Program  07/18/2021  [name] [birth year] [id number]   [facility] Evaluation Interviewer final attempt to call patient on today regarding Aging Gracefully referral. No answer from patient after multiple rings. CMA unable to leave confidential voicemail for patient to return call.due to mailbox full  CMA will refer patient back to Sharol Given with [facility] for follow up.  [place] Management Assistant [phone]

## 2021-08-31 ENCOUNTER — Encounter: Payer: Self-pay | Admitting: Family

## 2021-10-12 ENCOUNTER — Ambulatory Visit: Payer: Medicare HMO | Admitting: Nurse Practitioner

## 2021-10-12 ENCOUNTER — Telehealth: Payer: Self-pay | Admitting: Nurse Practitioner

## 2021-10-12 ENCOUNTER — Other Ambulatory Visit (INDEPENDENT_AMBULATORY_CARE_PROVIDER_SITE_OTHER): Payer: Medicare HMO

## 2021-10-12 ENCOUNTER — Encounter: Payer: Self-pay | Admitting: Nurse Practitioner

## 2021-10-12 VITALS — BP 100/50 | HR 76 | Ht 72.0 in | Wt 229.0 lb

## 2021-10-12 DIAGNOSIS — R748 Abnormal levels of other serum enzymes: Secondary | ICD-10-CM

## 2021-10-12 DIAGNOSIS — K219 Gastro-esophageal reflux disease without esophagitis: Secondary | ICD-10-CM | POA: Diagnosis not present

## 2021-10-12 DIAGNOSIS — D539 Nutritional anemia, unspecified: Secondary | ICD-10-CM | POA: Diagnosis not present

## 2021-10-12 LAB — LIPASE: Lipase: 126 U/L — ABNORMAL HIGH (ref 11.0–59.0)

## 2021-10-12 LAB — CBC
HCT: 30.9 % — ABNORMAL LOW (ref 39.0–52.0)
Hemoglobin: 10.2 g/dL — ABNORMAL LOW (ref 13.0–17.0)
MCHC: 33.1 g/dL (ref 30.0–36.0)
MCV: 98.8 fl (ref 78.0–100.0)
Platelets: 190 10*3/uL (ref 150.0–400.0)
RBC: 3.13 Mil/uL — ABNORMAL LOW (ref 4.22–5.81)
RDW: 13.7 % (ref 11.5–15.5)
WBC: 5.4 10*3/uL (ref 4.0–10.5)

## 2021-10-12 LAB — B12 AND FOLATE PANEL
Folate: >24.2 ng/mL (ref >5.9)
Vitamin B-12: 1308 pg/mL — ABNORMAL HIGH (ref 211–911)

## 2021-10-12 LAB — AMYLASE: Amylase: 448 U/L — ABNORMAL HIGH (ref 27–131)

## 2021-10-12 LAB — FERRITIN: Ferritin: 45.1 ng/mL (ref 22.0–322.0)

## 2021-10-12 NOTE — Patient Instructions (Addendum)
Acid Reflux --To help with acid reflux symptoms you should avoid evening meals / bedtime snacks. If able, elevate head of bed 6-8 inches. If unable to elevate the head of the bed consider purchasing a wedge pillow to sleep on at night.    --Weight reduction / maintain a healthy BMI ( body mass index) may be help with reflux symptoms  --Avoid trigger foods ( foods which you know tend to aggravate you reflux symptoms). Some of the more common triggers include spicy foods, fatty foods, acidic foods, and chocolate --Avoid caffeine  Continue Protonix. We will contact you to schedule the procedures once we have all your lab results to review. LABS:   Please proceed to the basement level for lab work before leaving today. Press "B" on the elevator. The lab is located at the first door on the left as you exit the elevator.  HEALTHCARE LAWS AND MY CHART RESULTS:   Due to recent changes in healthcare laws, you may see results of your imaging and/or laboratory studies on MyChart before I have had a chance to review them.  I understand that in some cases there may be results that are confusing or concerning to you. Please understand that not all results are received at the same time and often I may need to interpret multiple results in order to provide you with the best plan of care or course of treatment. Therefore, I ask that you please give me 48 hours to thoroughly review all your results before contacting my office for clarification.   BMI:  If you are age 74 or older, your body mass index should be between 23-30. Your Body mass index is 31.06 kg/m. If this is out of the aforementioned range listed, please consider follow up with your Primary Care Provider.  If you are age 56 or younger, your body mass index should be between 19-25. Your Body mass index is 31.06 kg/m. If this is out of the aformentioned range listed, please consider follow up with your Primary Care Provider.   MY CHART:  The  [facility] providers would like to encourage you to use [facility] to communicate with providers for non-urgent requests or questions.  Due to long hold times on the telephone, sending your provider a message by [facility] may be a faster and more efficient way to get a response.  Please allow 48 business hours for a response.  Please remember that this is for non-urgent requests.   Thank you for trusting me with your gastrointestinal care!    Tye Savoy, NP

## 2021-10-12 NOTE — Progress Notes (Signed)
ASSESSMENT AND PLAN    # 76 yo male with chronically elevated amylase ( 3x ULN) and lipase ( 2x ULN). We were working this up in 2021 but he didn't follow up. The most recent labs available to me are from nearly a year ago when in April 2022 his amylase was 441 / lipase 129.  --Patient says he had labs with PCP in December, will request those --Obtain Amylase and lipase today --Depending on results we may need to proceed with EUS as was originally the plan in 2021.   # Chronic GERD ( 25 years). Having breakthrough symptoms on BID PPI and BID Pepcid.  --Discussed anti-reflux measures such as avoidance of evening meals / bedtime snacks. Written GERD literature given. See AVS --For now, continue BID PPI and BID Pepcid  --Wil need EGD for evaluation of breakthrough symptoms and Barrett's screening. Need to hold off until it is determined whether he will also need EUS for persistent elevation in pancreatic enzymes    # Macrocytic anemia. Anemia is chronic, probably secondary to CKD. Hgb 9.8 / MCV 100 in April 2022. --CBC, iron studies,  B12 and folate  # History of adenomatous colon polyps ( one 65mm) April 2014. No blood in stool or other alarm features except for anemia but that is probably CKD related.  --May need to proceed with colonoscopy if iron deficient. Await labs.   HISTORY OF PRESENT ILLNESS    Chief Complaint : reflux . Also got letter from our office that PCP wanted him seen here.   [name] is a 75 y.o. male with a past medical history of GERD, fatty liver, prostate cancer , hepatic steatosis, RCC s/p right nephrectomy, CKD IV. Additional medical history as listed in [place] .   Patient is known to Dr.  Tarri Glenn. He was seen by Ellouise Newer, PA in January 2021 and February 2021 for upper abdominal pain and elevated lipase. CT scan with ? Mass in scapula. We repeated amylase / lipase, they was still elevated at 513 / 94.  We recommended repeat amylase and lipase in 3  months. Repeat labs in May 2021 showed  amylase of 508 and lipase of 84. Imaging would be limited given creatinine so we recommended EGD with EUS. He was given an appt to see Dr Tarri Glenn in June 2021 but didn't show. We haven't seen him since.   In September he did have an MRI abd to evaluate left renal cysts. No abnormalities of pancreas were reported.   INTERVAL HISTORY:  Of note, patient has no recollection of being evaluating here in 2021 for elevated pancreatic enzymes.   Patient says his PCP has sent him over for some abnormalites of his labwork but I don't have any results   Patient also tells me he has been having worsening of his chronic acid reflux. For many years (maybe 25 years) drinking water would help. He mentioned this to his PCP who started him on both pantoprazole BID and Pepcid BID. He says that prior to this he didn't need medications for reflux ( though he was on Omeprazole when we saw him in 2021). The reflux symptoms have actually gotten worse over the last couple of months though he has been eating some things which may be triggering symptoms. No dysphagia.   PREVIOUS IMAGING   Jan 2021 CTAP wo contrast Hepatic steatosis.New subcentimeter sclerotic focus in the inferior left scapula. This is indeterminate and could reflect a sclerotic metastasis. Small hiatal  hernia   Sept 2021 MRI abd  1. Status post right nephrectomy. No evidence of local recurrence,lymphadenopathy, or metastatic disease in the abdomen. 2. Numerous left renal cysts of varying sizes, most notably acluster of thinly septated cysts in the midportion of the left kidney measuring approximately 5.4 cm in greatest extent. These are not grossly changed in appearance compared to prior examinations and demonstrate no evident solid component or suspicious contrast enhancement. There are additional simple cysts and fluid signal lesions too small to characterize although statistically likely tobe subcentimeter  simple cysts. No specific follow-up is indicated for these cysts, although ongoing surveillance for renal cell carcinoma can be considered given patient history of contralateral renal cell carcinoma   PREVIOUS ENDOSCOPIC EVALUATIONS    Colonoscopy 2014 - [facility] Complete exam, good prep. Transverse colon polyp ( 3 mm) removed.   DATA REVIEWED PCP 's office 12/16/20 Liver chemistries normal Hemoglobin 9.8, MCV 100.3, platelets 175 Amylase 441, lipase 129.  Creatinine 3.72, GFR 18   Past Medical History:  Diagnosis Date   Allergy    Cancer ([place])    prostate /renal ca   GERD (gastroesophageal reflux disease)    Hyperlipidemia    Renal insufficiency    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    Past Surgical History:  Procedure Laterality Date   COLONOSCOPY  04/17/12   Poor prep   COLONOSCOPY  12/20/12   Repeat, 1 Polyp - needs 67yr f/u   FOOT SURGERY     NEPHRECTOMY      Current Medications, Allergies, Family History and Social History were reviewed in [employer] record.     Current Outpatient Medications  Medication Sig Dispense Refill   atorvastatin (LIPITOR) 40 MG tablet Take 40 mg by mouth daily.     famotidine (PEPCID) 20 MG tablet Take 20 mg by mouth 2 (two) times daily.     furosemide (LASIX) 40 MG tablet Take 40 mg by mouth daily.     hydrALAZINE (APRESOLINE) 100 MG tablet Take 100 mg by mouth 2 (two) times daily.     isosorbide mononitrate (IMDUR) 30 MG 24 hr tablet Take 30 mg by mouth daily.     pantoprazole (PROTONIX) 40 MG tablet Take 40 mg by mouth 2 (two) times daily.     propranolol (INDERAL) 60 MG tablet Take 60 mg by mouth daily.     No current facility-administered medications for this visit.    Review of Systems: No chest pain. No shortness of breath. No urinary complaints.   PHYSICAL EXAM :    Wt Readings from Last 3 Encounters:  10/12/21 229 lb (103.9 kg)  05/19/21 226 lb (102.5 kg)  03/24/21 228 lb (103.4 kg)     Ht 6' (1.829 m)    Wt 229 lb (103.9 kg)    BMI 31.06 kg/m  Constitutional:  Generally well appearing male in no acute distress. Psychiatric: Pleasant. Normal mood and affect. Behavior is normal. EENT: Pupils normal.  Conjunctivae are normal. No scleral icterus. Neck supple.  Cardiovascular: Normal rate, regular rhythm, 3+ RLE edema, 1-2+ LLE edema.  Pulmonary/chest: Effort normal and breath sounds normal. No wheezing, rales or rhonchi. Abdominal: Soft, nondistended, nontender. Bowel sounds active throughout. There are no masses palpable. No hepatomegaly. Neurological: Alert and oriented to person place and time. Skin: Skin is warm and dry. No rashes noted.  Tye Savoy, NP  10/12/2021, 9:07 AM  Cc:  Sonia Side., FNP

## 2021-10-12 NOTE — Telephone Encounter (Signed)
Patient called back and said the Propranolol 80 mg not 60 mg and he also takes Primidone 50 mg.

## 2021-10-13 ENCOUNTER — Telehealth: Payer: Self-pay | Admitting: Nurse Practitioner

## 2021-10-13 LAB — IRON AND TIBC
Iron Saturation: 32 % (ref 15–55)
Iron: 94 ug/dL (ref 38–169)
Total Iron Binding Capacity: 292 ug/dL (ref 250–450)
UIBC: 198 ug/dL (ref 111–343)

## 2021-10-13 NOTE — Telephone Encounter (Signed)
Patient called for lab results.

## 2021-10-14 NOTE — Telephone Encounter (Signed)
Willia Craze, NP  Fartun Paradiso, Real Cons, LPN Hgb is stable. Received labs from PCP and hgb was 10 on 07/04/21. Doesn't appear to have iron deficiency. B12 and folate okay. Suspect anemia of chronic disease. Separately,  his amylase / lipase are still high. I will be discussing this with Dr. Tarri Glenn . We will have to get back with him on that. Thanks   Called the patient. No answer. No voicemail.

## 2021-10-16 NOTE — Progress Notes (Signed)
Note and labs results reviewed. EUS recommended for further evaluation.   Katria Botts L. Tarri Glenn, MD, MPH

## 2021-10-19 NOTE — Telephone Encounter (Signed)
Medication list updated as per below.

## 2021-10-31 NOTE — Telephone Encounter (Signed)
[name], the patient is inquiring on the plan for him. ?Do you want to repeat any labs? ?Thanks ?

## 2021-10-31 NOTE — Telephone Encounter (Signed)
Patient calling in regards to labs, please advise.  ?

## 2021-11-01 NOTE — Telephone Encounter (Signed)
I sent you a staff message on him earlier. I am sending case to Drs Rush Landmark and Ardis Hughs about getting an EUS.   ?

## 2021-11-01 NOTE — Telephone Encounter (Signed)
[name] ?This is to keep you in the loop. The patient is aware of the plan. He agrees to it. Very briefly told him the prep is fasting and that you will go into detail about the time, place and prep. His first obstacle will be a care partner. He will ask his cousin, but he will probably need advanced notice to get off of work. Let me know if you need me to do anything. ?Thanks ?

## 2021-11-10 ENCOUNTER — Telehealth: Payer: Self-pay | Admitting: Nurse Practitioner

## 2021-11-10 ENCOUNTER — Other Ambulatory Visit: Payer: Self-pay

## 2021-11-10 ENCOUNTER — Telehealth: Payer: Self-pay

## 2021-11-10 NOTE — Telephone Encounter (Signed)
Orders placed for MRI/MRCP - Without gadolinium but with diffusion imaging. Message sent to [facility] Scheduling for scheduling purposes. Reminder created to f/u to ensure pt has been scheduled in a timely manner. ?

## 2021-11-10 NOTE — Telephone Encounter (Signed)
Inbound call from [facility]. Requesting office notes be faxed. Fax number [phone] ?

## 2021-11-10 NOTE — Telephone Encounter (Signed)
-----   Message from Willia Craze, NP sent at 11/10/2021  1:44 PM EDT ----- ?Yes I told him that we would possibly want an MRI / EUS when I saw him in clinic as this was the paln when we originally saw him for same problem a couple of years ago. However, if he has any questions then let me know. This is too evaluate persistently elevated amylase, / lipase. Thanks ?----- Message ----- ?From: Aleatha Borer, LPN ?Sent: 11/09/2021  11:00 AM EDT ?To: Willia Craze, NP ? ?Please advise if you have discussed these recommendations with the pt. If so and pt is agreeable, will proceed with scheduling MRI/MRCP without gadolinium but with diffusion imaging. ? ?Thank you, ?Dorell Gatlin ?----- Message ----- ?From: Thornton Park, MD ?Sent: 11/09/2021   9:54 AM EDT ?To: Willia Craze, NP, Aleatha Borer, LPN ? ?If MRI/MRCP without gadolinium but with diffusion imaging  has not be arranged, this would be the next step. Followed by EUS with Dr. Rush Landmark or Edison Nasuti as outlined below. I am not sure if Nevin Bloodgood has already reviewed these recommendations with the patients or requested scheduling.  ? ?Thanks.  ? ?KLB ?----- Message ----- ?From: Mansouraty, Telford Nab., MD ?Sent: 11/01/2021   5:24 PM EDT ?To: Milus Banister, MD, Timothy Lasso, RN, # ? ?PG, ?EUS is not unreasonable in the setting of persistent amylase/lipase elevations and progressive GERD symptoms. ?However, I would recommend cross-sectional imaging be obtained first. ?If he can tolerate an MRI/MRCP without gadolinium but with diffusion imaging that would be preferential.  If he cannot tolerate MRI/MRCP then a CT abdomen without contrast will do.  This is because of his advanced renal disease. ?If you and Dr. Tarri Glenn agree then Chong Sicilian can work on scheduling the EUS. ?We can work on getting the EUS scheduled sooner somehow, if something is found on the imaging study.   ?Thanks. ?GM ?----- Message ----- ?From: Willia Craze, NP ?Sent: 11/01/2021  12:39 PM EST ?To: Milus Banister, MD, # ? ?Hi,  ?Would one of you be able to do an EUS on this patient? He has chronic elevations of amy / lipase. KB recommending EUS. Also an EGD would be helpful for worsening GERD symptoms.  ?Thanks,  ?Pg ? ? ? ? ? ?

## 2021-11-10 NOTE — Telephone Encounter (Signed)
Chart Review Routing History ? ?Recipients Sent On Sent By Routed Reports  ? Sonia Side., FNP  ? [place]  ? 11/10/2021  4:15 PM Kolton Kienle Hardie Pulley, LPN Office Visit on 03/25/2059 with Willia Craze, NP  ?  ?  Cover Page Message : Office notes attached as requested  ?  ?  ? ?

## 2021-11-17 ENCOUNTER — Telehealth: Payer: Self-pay

## 2021-11-17 NOTE — Telephone Encounter (Signed)
See message below Dr Rush Landmark and Nevin Bloodgood are waiting for the imaging results first.   ?  ?   ?Previous Messages ?  ?----- Message -----  ?From: Milus Banister, MD  ?Sent: 11/01/2021   6:52 PM EDT  ?To: Timothy Lasso, RN, Willia Craze, NP, *  ? ?I agree with imaging first.  ? ? ?----- Message -----  ?From: Mansouraty, Telford Nab., MD  ?Sent: 11/01/2021   5:24 PM EST  ?To: Milus Banister, MD, Timothy Lasso, RN, *  ? ?PG,  ?EUS is not unreasonable in the setting of persistent amylase/lipase elevations and progressive GERD symptoms.  ?However, I would recommend cross-sectional imaging be obtained first.  ?If he can tolerate an MRI/MRCP without gadolinium but with diffusion imaging that would be preferential.  If he cannot tolerate MRI/MRCP then a CT abdomen without contrast will do.  This is because of his advanced renal disease.  ?If you and Dr. Tarri Glenn agree then Chong Sicilian can work on scheduling the EUS.  ?We can work on getting the EUS scheduled sooner somehow, if something is found on the imaging study.    ?Thanks.  ?GM  ?----- Message -----  ?From: Willia Craze, NP  ?Sent: 11/01/2021  12:39 PM EST  ?To: Milus Banister, MD, *  ? ?Hi,  ?Would one of you be able to do an EUS on this patient? He has chronic elevations of amy / lipase. KB recommending EUS. Also an EGD would be helpful for worsening GERD symptoms.  ?Thanks,  ?Pg  ? ? ?  ?

## 2021-11-17 NOTE — Telephone Encounter (Signed)
-----   Message from Aleatha Borer, LPN sent at 3/61/2244  4:43 PM EDT ----- ?Regarding: FW: Pt calls requesting clarification on procedure scheduling ?Including you as this is a Mansouraty pt. I am not certain if he is referring to the EUS that may need to be considered following results of MRI/MRCP ?----- Message ----- ?From: Debarah Crape, RN ?Sent: 11/17/2021   4:39 PM EDT ?To: Willia Craze, NP, Aleatha Borer, LPN ?Subject: Pt calls requesting clarification on procedu# ? ?Good afternoon all, ? ?[name] called our department here at [facility] enquiring about a procedure scheduled for 1030 on 3/28. He described some type of upper endoscopic procedure ("they're gonna go down my throat and look around"). I do not see such a procedure on our schedule at either campus. I do see the encounter with [facility] scheduled for 3/27 at 1100. I advised the patient of this appointment and advised him to reach out to the office for further clarification. I also assured him that I would also reach out and notify you all. ? ?Thank you, ?Dulcy Fanny, RN ?[facility] ? ? ?

## 2021-11-18 ENCOUNTER — Telehealth: Payer: Self-pay

## 2021-11-18 NOTE — Telephone Encounter (Signed)
I called the patient. Reassured him of the plan. He will go to the imaging appointment on 11/21/21. He will wait to hear from [place] next week after the doctor has reviewed the imaging results and decided on the next step. ?Patient thanks me for the call. ?

## 2021-11-18 NOTE — Telephone Encounter (Signed)
Opened in error

## 2021-11-21 ENCOUNTER — Other Ambulatory Visit: Payer: Self-pay

## 2021-11-21 ENCOUNTER — Ambulatory Visit (HOSPITAL_COMMUNITY)
Admission: RE | Admit: 2021-11-21 | Discharge: 2021-11-21 | Disposition: A | Discharge status: Home / Self Care | Payer: Medicare HMO | Source: Ambulatory Visit | Attending: Nurse Practitioner | Admitting: Nurse Practitioner

## 2021-11-21 DIAGNOSIS — R748 Abnormal levels of other serum enzymes: Secondary | ICD-10-CM

## 2021-11-28 ENCOUNTER — Other Ambulatory Visit: Payer: Self-pay

## 2021-11-28 ENCOUNTER — Telehealth: Payer: Self-pay | Admitting: Nurse Practitioner

## 2021-11-28 DIAGNOSIS — K838 Other specified diseases of biliary tract: Secondary | ICD-10-CM

## 2021-11-28 DIAGNOSIS — K8689 Other specified diseases of pancreas: Secondary | ICD-10-CM

## 2021-11-28 DIAGNOSIS — K869 Disease of pancreas, unspecified: Secondary | ICD-10-CM

## 2021-11-28 NOTE — Telephone Encounter (Signed)
Inbound call from patient stating that he had an MRI done last week on 3/28 and is seeking advice if he needs to have an EGD done. Please advise.  ?

## 2021-11-28 NOTE — Telephone Encounter (Signed)
Spoke with the patient. Advised he is scheduled at [facility] for 01/26/22 at 7:30 am for his EUS with Dr Ardis Hughs. He will expect his instructions to be mailed. ?

## 2022-01-16 ENCOUNTER — Encounter (HOSPITAL_COMMUNITY): Payer: Self-pay | Admitting: Gastroenterology

## 2022-01-16 NOTE — Progress Notes (Signed)
Attempted to obtain medical history via telephone, unable to reach at this time. Unable to leave voicemail to return pre surgical testing department's phone call,due to mailbox not set up.   

## 2022-01-25 NOTE — Anesthesia Preprocedure Evaluation (Signed)
Anesthesia Evaluation  Patient identified by MRN, date of birth, ID band Patient awake    Reviewed: Allergy & Precautions, NPO status , Patient's Chart, lab work & pertinent test results  Airway Mallampati: III  TM Distance: >3 FB Neck ROM: Full    Dental no notable dental hx. (+) Teeth Intact, Dental Advisory Given   Pulmonary neg pulmonary ROS,    Pulmonary exam normal breath sounds clear to auscultation       Cardiovascular hypertension, Normal cardiovascular exam Rhythm:Regular Rate:Normal  03/2021 TTE 1. Left ventricular ejection fraction, by estimation, is 65 to 70%. The  left ventricle has normal function. The left ventricle has no regional  wall motion abnormalities. The left ventricular internal cavity size was  mildly dilated. Left ventricular  diastolic parameters were normal.  2. Right ventricular systolic function is normal. The right ventricular  size is normal.  3. The mitral valve is normal in structure. No evidence of mitral valve  regurgitation.  4. The aortic valve is normal in structure. Aortic valve regurgitation is  not visualized.  5. The inferior vena cava is normal in size with greater than 50%  respiratory variability, suggesting right atrial pressure of 3 mmHg.    Neuro/Psych negative neurological ROS     GI/Hepatic GERD  ,  Endo/Other  diabetes  Renal/GU Renal disease     Musculoskeletal   Abdominal   Peds  Hematology   Anesthesia Other Findings   Reproductive/Obstetrics                          Anesthesia Physical Anesthesia Plan  ASA: 3  Anesthesia Plan: MAC   Post-op Pain Management:    Induction:   PONV Risk Score and Plan: Treatment may vary due to age or medical condition  Airway Management Planned: Natural Airway  Additional Equipment: None  Intra-op Plan:   Post-operative Plan:   Informed Consent: I have reviewed the patients History  and Physical, chart, labs and discussed the procedure including the risks, benefits and alternatives for the proposed anesthesia with the patient or authorized representative who has indicated his/her understanding and acceptance.     Dental advisory given  Plan Discussed with: CRNA and Anesthesiologist  Anesthesia Plan Comments:        Anesthesia Quick Evaluation

## 2022-01-26 ENCOUNTER — Encounter (HOSPITAL_COMMUNITY)
Admission: RE | Disposition: A | Discharge status: Home / Self Care | Payer: Self-pay | Source: Ambulatory Visit | Attending: Gastroenterology

## 2022-01-26 ENCOUNTER — Other Ambulatory Visit: Payer: Self-pay

## 2022-01-26 ENCOUNTER — Ambulatory Visit (HOSPITAL_COMMUNITY)
Admission: RE | Admit: 2022-01-26 | Discharge: 2022-01-26 | Disposition: A | Discharge status: Home / Self Care | Payer: Medicare HMO | Source: Ambulatory Visit | Attending: Gastroenterology | Admitting: Gastroenterology

## 2022-01-26 ENCOUNTER — Ambulatory Visit (HOSPITAL_BASED_OUTPATIENT_CLINIC_OR_DEPARTMENT_OTHER): Payer: Medicare HMO | Admitting: Anesthesiology

## 2022-01-26 ENCOUNTER — Ambulatory Visit (HOSPITAL_COMMUNITY): Payer: Medicare HMO | Admitting: Anesthesiology

## 2022-01-26 ENCOUNTER — Encounter (HOSPITAL_COMMUNITY): Payer: Self-pay | Admitting: Gastroenterology

## 2022-01-26 DIAGNOSIS — I129 Hypertensive chronic kidney disease with stage 1 through stage 4 chronic kidney disease, or unspecified chronic kidney disease: Secondary | ICD-10-CM | POA: Insufficient documentation

## 2022-01-26 DIAGNOSIS — R748 Abnormal levels of other serum enzymes: Secondary | ICD-10-CM | POA: Diagnosis not present

## 2022-01-26 DIAGNOSIS — E1122 Type 2 diabetes mellitus with diabetic chronic kidney disease: Secondary | ICD-10-CM | POA: Diagnosis not present

## 2022-01-26 DIAGNOSIS — I10 Essential (primary) hypertension: Secondary | ICD-10-CM

## 2022-01-26 DIAGNOSIS — N189 Chronic kidney disease, unspecified: Secondary | ICD-10-CM | POA: Insufficient documentation

## 2022-01-26 DIAGNOSIS — R1310 Dysphagia, unspecified: Secondary | ICD-10-CM

## 2022-01-26 DIAGNOSIS — K869 Disease of pancreas, unspecified: Secondary | ICD-10-CM

## 2022-01-26 DIAGNOSIS — K861 Other chronic pancreatitis: Secondary | ICD-10-CM | POA: Diagnosis not present

## 2022-01-26 DIAGNOSIS — K9189 Other postprocedural complications and disorders of digestive system: Secondary | ICD-10-CM | POA: Diagnosis not present

## 2022-01-26 DIAGNOSIS — K838 Other specified diseases of biliary tract: Secondary | ICD-10-CM | POA: Diagnosis not present

## 2022-01-26 DIAGNOSIS — E119 Type 2 diabetes mellitus without complications: Secondary | ICD-10-CM | POA: Diagnosis not present

## 2022-01-26 DIAGNOSIS — K8689 Other specified diseases of pancreas: Secondary | ICD-10-CM

## 2022-01-26 HISTORY — PX: BIOPSY: SHX5522

## 2022-01-26 HISTORY — PX: EUS: SHX5427

## 2022-01-26 HISTORY — PX: ESOPHAGOGASTRODUODENOSCOPY (EGD) WITH PROPOFOL: SHX5813

## 2022-01-26 LAB — GLUCOSE, CAPILLARY: Glucose-Capillary: 81 mg/dL (ref 70–99)

## 2022-01-26 SURGERY — ESOPHAGOGASTRODUODENOSCOPY (EGD) WITH PROPOFOL
Anesthesia: Monitor Anesthesia Care

## 2022-01-26 MED ORDER — PROPOFOL 1000 MG/100ML IV EMUL
INTRAVENOUS | Status: AC
  Filled 2022-01-26: qty 100

## 2022-01-26 MED ORDER — PROPOFOL 500 MG/50ML IV EMUL
INTRAVENOUS | Status: DC | PRN
  Administered 2022-01-26: 125 ug/kg/min via INTRAVENOUS

## 2022-01-26 MED ORDER — SODIUM CHLORIDE 0.9 % IV SOLN
INTRAVENOUS | Status: DC
  Administered 2022-01-26: 1000 mL via INTRAVENOUS

## 2022-01-26 MED ORDER — PROPOFOL 500 MG/50ML IV EMUL
INTRAVENOUS | Status: AC
  Filled 2022-01-26: qty 50

## 2022-01-26 MED ORDER — PROPOFOL 10 MG/ML IV BOLUS
INTRAVENOUS | Status: DC | PRN
  Administered 2022-01-26: 10 mg via INTRAVENOUS
  Administered 2022-01-26: 20 mg via INTRAVENOUS

## 2022-01-26 SURGICAL SUPPLY — 15 items

## 2022-01-26 NOTE — Transfer of Care (Signed)
Immediate Anesthesia Transfer of Care Note  Patient: [name]  Procedure(s) Performed: ESOPHAGOGASTRODUODENOSCOPY (EGD) WITH PROPOFOL UPPER ENDOSCOPIC ULTRASOUND (EUS) RADIAL BIOPSY  Patient Location: PACU  Anesthesia Type:MAC  Level of Consciousness: awake, alert  and oriented  Airway & Oxygen Therapy: Patient Spontanous Breathing and Patient connected to face mask oxygen  Post-op Assessment: Report given to RN, Post -op Vital signs reviewed and stable and Patient moving all extremities X 4  Post vital signs: Reviewed and stable  Last Vitals:  Vitals Value Taken Time  BP 113/60   Temp    Pulse 70 01/26/22 0854  Resp 12 01/26/22 0854  SpO2 100 % 01/26/22 0854  Vitals shown include unvalidated device data.  Last Pain:  Vitals:   01/26/22 0738  TempSrc: Temporal  PainSc: 0-No pain         Complications: No notable events documented.

## 2022-01-26 NOTE — Op Note (Signed)
[facility] Patient Name: [name] Procedure Date: 01/26/2022 MRN: [id number] Attending MD: Milus Banister , MD Date of Birth: [birth year] CSN: [id number] Age: 75 Admit Type: Outpatient Procedure:                Upper EUS Indications:              Chronically elevated amylase and lipase, normal                            LFTs (last value 2022), biggest clinical symptom is                            swallowing difficulty Providers:                Milus Banister, MD, Burtis Junes, RN, William Dalton, Technician Referring MD:             Thornton Park, MD Medicines:                Monitored Anesthesia Care Complications:            No immediate complications. Estimated blood loss:                            None. Estimated Blood Loss:     Estimated blood loss: none. Procedure:                Pre-Anesthesia Assessment:                           - Prior to the procedure, a History and Physical                            was performed, and patient medications and                            allergies were reviewed. The patient's tolerance of                            previous anesthesia was also reviewed. The risks                            and benefits of the procedure and the sedation                            options and risks were discussed with the patient.                            All questions were answered, and informed consent                            was obtained. Prior Anticoagulants: The patient has                            taken no  previous anticoagulant or antiplatelet                            agents. ASA Grade Assessment: II - A patient with                            mild systemic disease. After reviewing the risks                            and benefits, the patient was deemed in                            satisfactory condition to undergo the procedure.                           After obtaining informed  consent, the endoscope was                            passed under direct vision. Throughout the                            procedure, the patient's blood pressure, pulse, and                            oxygen saturations were monitored continuously. The                            [id number] ([id number]) Olympus radial ultrasound                            scope was introduced through the mouth, and                            advanced to the second part of duodenum. The upper                            EUS was accomplished without difficulty. The                            patient tolerated the procedure well. Scope In: Scope Out: Findings:      ENDOSCOPIC FINDING (with ERCP scope and radial EUS scope): :      The examined esophagus was endoscopically normal.      The entire examined stomach was endoscopically normal.      The major papilla was prominent, directly adjacent to the papillary       orifice was a 1 cm nodule. This is possibly a polyp, it does not appear       overtly cancerous. I biopsied the nodule with forceps      ENDOSONOGRAPHIC FINDING: :      1. Pancreatic parenchyma had diffuse changes of chronic pancreatitis       including honeycombing, hyperechoic stranding, lobularity. I did not see       any discrete tumors or masses however the changes of chronic       pancreatitis can decrease the sensitivity of  this exam to find tumors.      2. The main pancreatic duct was dilated in the head and genu, up to 6       mm. The main pancreatic duct then tapered to normal throughout the rest       of the pancreas and has hyperechoic borders.      3. The CBD was slightly dilated at 6 mm. It contained no stones or soft       tissue masses.      4. The periampullary nodule described above was barely visible by       ultrasound, it seems to correlate with a generous deep mucosal and       submucosal layer of the duodenal wall. Certainly no obvious tumors or       masses and no  involvement of the muscularis propria.      5. Limited views of the liver, spleen, portal and splenic vessels were       all normal Impression:               - Changes of chronic pancreatitis.                           - 1 cm periampullary nodule that does not show                            signs of deeper mass, no signs of involvement with                            the muscularis propria. I suspect this is a                            periampullary duodenal polyp, I biopsied it to                            exclude cancer.                           - Unclear if either of these findings explain his                            chronically elevated amylase and lipase (several                            hundreds). He has chronic renal insufficiency (Cr                            3.7), perhaps that is playing a role? Moderate Sedation:      Not Applicable - Patient had care per Anesthesia. Recommendation:           - Discharge patient to home.                           - Await final pathology results. Procedure Code(s):        --- Professional ---                           [phone], Esophagogastroduodenoscopy,  flexible,                            transoral; with endoscopic ultrasound examination                            limited to the esophagus, stomach or duodenum, and                            adjacent structures Diagnosis Code(s):        --- Professional ---                           R74.8, Abnormal levels of other serum enzymes                           K83.8, Other specified diseases of biliary tract CPT copyright 2019 American Medical Association. All rights reserved. The codes documented in this report are preliminary and upon coder review may  be revised to meet current compliance requirements. Milus Banister, MD 01/26/2022 9:02:53 AM This report has been signed electronically. Number of Addenda: 0

## 2022-01-26 NOTE — Discharge Instructions (Signed)
YOU HAD AN ENDOSCOPIC PROCEDURE TODAY: Refer to the procedure report and other information in the discharge instructions given to you for any specific questions about what was found during the examination. If this information does not answer your questions, please call [place] office at [phone] to clarify.  ° °YOU SHOULD EXPECT: Some feelings of bloating in the abdomen. Passage of more gas than usual. Walking can help get rid of the air that was put into your GI tract during the procedure and reduce the bloating. If you had a lower endoscopy (such as a colonoscopy or flexible sigmoidoscopy) you may notice spotting of blood in your stool or on the toilet paper. Some abdominal soreness may be present for a day or two, also. ° °DIET: Your first meal following the procedure should be a light meal and then it is ok to progress to your normal diet. A half-sandwich or bowl of soup is an example of a good first meal. Heavy or fried foods are harder to digest and may make you feel nauseous or bloated. Drink plenty of fluids but you should avoid alcoholic beverages for 24 hours. If you had a esophageal dilation, please see attached instructions for diet.   ° °ACTIVITY: Your care partner should take you home directly after the procedure. You should plan to take it easy, moving slowly for the rest of the day. You can resume normal activity the day after the procedure however YOU SHOULD NOT DRIVE, use power tools, machinery or perform tasks that involve climbing or major physical exertion for 24 hours (because of the sedation medicines used during the test).  ° °SYMPTOMS TO REPORT IMMEDIATELY: °A gastroenterologist can be reached at any hour. Please call [phone]  for any of the following symptoms:  °Following lower endoscopy (colonoscopy, flexible sigmoidoscopy) °Excessive amounts of blood in the stool  °Significant tenderness, worsening of abdominal pains  °Swelling of the abdomen that is new, acute  °Fever of 100° or  higher  °Following upper endoscopy (EGD, EUS, ERCP, esophageal dilation) °Vomiting of blood or coffee ground material  °New, significant abdominal pain  °New, significant chest pain or pain under the shoulder blades  °Painful or persistently difficult swallowing  °New shortness of breath  °Black, tarry-looking or red, bloody stools ° °FOLLOW UP:  °If any biopsies were taken you will be contacted by phone or by letter within the next 1-3 weeks. Call [phone]  if you have not heard about the biopsies in 3 weeks.  °Please also call with any specific questions about appointments or follow up tests. ° °

## 2022-01-26 NOTE — H&P (Addendum)
HPI: This is a man with chronically elevated amylase and lipase  MRI recently suggests slightly dilated CBD and PD  Most recent LFTS 2021 were normal  Does have CRI (last Cr I can see is 3.7 (from 2022 outside labs)  He tells me today that he has never heard about having any type of problems with his pancreas or his pancreatic labs.   ROS: complete GI ROS as described in HPI, all other review negative.  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   T2DM (type 2 diabetes mellitus) ([place])     Past Surgical History:  Procedure Laterality Date   COLONOSCOPY  04/17/12   Poor prep   COLONOSCOPY  12/20/12   Repeat, 1 Polyp - needs 84yrf/u   FOOT SURGERY     NEPHRECTOMY      Current Outpatient Medications  Medication Instructions   atorvastatin (LIPITOR) 40 mg, Oral, Daily at bedtime   famotidine (PEPCID) 20 mg, Oral, 2 times daily   furosemide (LASIX) 40 mg, Oral, Every other day   hydrALAZINE (APRESOLINE) 100 mg, Oral, 2 times daily   isosorbide mononitrate (IMDUR) 30 mg, Oral, Daily   Multiple Vitamins-Minerals (ALIVE MENS 50+ PO) 1 tablet, Oral, Daily   OVER THE COUNTER MEDICATION 1 capsule, Oral, 3 times daily with meals, Kidney Factor (promotes kidney health)   pantoprazole (PROTONIX) 40 mg, Oral, 2 times daily   primidone (MYSOLINE) 50 mg, Oral, Daily at bedtime   propranolol ER (INDERAL LA) 80 mg, Oral, Daily    Allergies as of 11/28/2021   (No Known Allergies)    Family History  Problem Relation Age of Onset   Asthma Mother    Cancer Maternal Grandmother    Cancer Maternal Grandfather    Colon cancer Neg Hx    Stomach cancer Neg Hx    Esophageal cancer Neg Hx     Social History    Socioeconomic History   Marital status: Single    Spouse name: Not on file   Number of children: Not on file   Years of education: Not on file   Highest education level: Not on file  Occupational History   Occupation: retired  Tobacco Use   Smoking status: Never   Smokeless tobacco: Never  Vaping Use   Vaping Use: Never used  Substance and Sexual Activity   Alcohol use: Never   Drug use: Never   Sexual activity: Not on file  Other Topics Concern   Not on file  Social History Narrative   Not on file   Social Determinants of Health   Financial Resource Strain: Not on file  Food Insecurity: Not on file  Transportation Needs: Not on file  Physical Activity: Not on file  Stress: Not on file  Social Connections: Not on file  Intimate Partner Violence: Not on file     Physical Exam:  Constitutional: generally well-appearing Psychiatric: alert and oriented x3 Abdomen: soft, nontender, nondistended, no obvious ascites, no peritoneal signs, normal bowel sounds No peripheral edema noted in lower extremities  Assessment and plan: 76y.o. male with elevated amylase and lipase  For upper EUS evaluation today  Please  see the "Patient Instructions" section for addition details about the plan.  Owens Loffler, MD [facility] 01/26/2022, 7:38 AM

## 2022-01-26 NOTE — Anesthesia Postprocedure Evaluation (Signed)
Anesthesia Post Note  Patient: [name]  Procedure(s) Performed: ESOPHAGOGASTRODUODENOSCOPY (EGD) WITH PROPOFOL UPPER ENDOSCOPIC ULTRASOUND (EUS) RADIAL BIOPSY     Patient location during evaluation: Endoscopy Anesthesia Type: MAC Level of consciousness: awake and alert Pain management: pain level controlled Vital Signs Assessment: post-procedure vital signs reviewed and stable Respiratory status: spontaneous breathing, nonlabored ventilation, respiratory function stable and patient connected to nasal cannula oxygen Cardiovascular status: blood pressure returned to baseline and stable Postop Assessment: no apparent nausea or vomiting Anesthetic complications: no   No notable events documented.  Last Vitals:  Vitals:   01/26/22 0910 01/26/22 0920  BP: (!) 144/95 (!) 147/68  Pulse: 71 67  Resp: 17 14  Temp:    SpO2: 97% 97%    Last Pain:  Vitals:   01/26/22 0920  TempSrc:   PainSc: 0-No pain                 Barnet Glasgow

## 2022-01-26 NOTE — Anesthesia Procedure Notes (Signed)
Procedure Name: MAC Date/Time: 01/26/2022 8:15 AM Performed by: Niel Hummer, CRNA Pre-anesthesia Checklist: Patient identified, Emergency Drugs available, Suction available and Patient being monitored Oxygen Delivery Method: Simple face mask

## 2022-01-30 LAB — SURGICAL PATHOLOGY

## 2022-02-10 ENCOUNTER — Telehealth: Payer: Self-pay | Admitting: Gastroenterology

## 2022-02-10 NOTE — Telephone Encounter (Signed)
Patient called and wanted results of his recent procedure.  Please call and advise.  Thank you.

## 2022-02-10 NOTE — Telephone Encounter (Signed)
Tried again to reach the pt and no answer and voice mail not set up.

## 2022-02-10 NOTE — Telephone Encounter (Signed)
Tried to reach pt and someone answers but does not speak will attempt another call

## 2022-02-13 ENCOUNTER — Other Ambulatory Visit: Payer: Self-pay

## 2022-02-13 DIAGNOSIS — K8689 Other specified diseases of pancreas: Secondary | ICD-10-CM

## 2022-02-13 DIAGNOSIS — K869 Disease of pancreas, unspecified: Secondary | ICD-10-CM

## 2022-02-13 DIAGNOSIS — R748 Abnormal levels of other serum enzymes: Secondary | ICD-10-CM

## 2022-02-13 NOTE — Telephone Encounter (Signed)
The pt has been advised that the results have not been reviewed as of today.  He will be called as soon as available. The pt has been advised of the information and verbalized understanding.

## 2022-02-14 ENCOUNTER — Other Ambulatory Visit: Payer: Medicare HMO

## 2022-02-14 DIAGNOSIS — R748 Abnormal levels of other serum enzymes: Secondary | ICD-10-CM

## 2022-02-14 DIAGNOSIS — K8689 Other specified diseases of pancreas: Secondary | ICD-10-CM

## 2022-02-14 DIAGNOSIS — K869 Disease of pancreas, unspecified: Secondary | ICD-10-CM

## 2022-02-15 LAB — IGG 4: IgG, Subclass 4: 38 mg/dL (ref 2–96)

## 2022-03-15 ENCOUNTER — Ambulatory Visit: Payer: Medicare HMO | Admitting: Nurse Practitioner

## 2022-03-15 ENCOUNTER — Encounter: Payer: Self-pay | Admitting: Nurse Practitioner

## 2022-03-15 VITALS — BP 120/72 | HR 87 | Ht 72.0 in | Wt 223.0 lb

## 2022-03-15 DIAGNOSIS — K861 Other chronic pancreatitis: Secondary | ICD-10-CM | POA: Diagnosis not present

## 2022-03-15 DIAGNOSIS — K219 Gastro-esophageal reflux disease without esophagitis: Secondary | ICD-10-CM | POA: Diagnosis not present

## 2022-03-15 NOTE — Patient Instructions (Addendum)
  GERD Continue pantoprazole 40 mg 30 minutes before breakfast. For now will try to hold evening dose of pantoprazole.  Continue Pepcid 20 mg but will change timing to before dinner and at bedtime.   Chronic pancreatitis Please read patient information on chronic pancreatitis.    If you are age 74 or older, your body mass index should be between 23-30. Your Body mass index is 30.24 kg/m. If this is out of the aforementioned range listed, please consider follow up with your Primary Care Provider.  If you are age 70 or younger, your body mass index should be between 19-25. Your Body mass index is 30.24 kg/m. If this is out of the aformentioned range listed, please consider follow up with your Primary Care Provider.   ________________________________________________________  The [facility] providers would like to encourage you to use [facility] to communicate with providers for non-urgent requests or questions.  Due to long hold times on the telephone, sending your provider a message by [facility] may be a faster and more efficient way to get a response.  Please allow 48 business hours for a response.  Please remember that this is for non-urgent requests.  _______________________________________________________

## 2022-03-15 NOTE — Progress Notes (Signed)
Chief Complaint:  follow up after EUS   Assessment &  Plan   # Chronically elevated amylase/lipase.  Recent EUS showed that he has chronic pancreatitis. Etiology at this point is not determined.   His IgG4 was normal speaking against autoimmune cause. No history of etoh use. Currently he feels okay and has no symptoms of exocrine insufficiency His lipase has been consistently elevated but generally no more than 3x the ULN. The degree of amylase elevation has been a little more impressive at 4 x the ULN. Perhaps decreased renal clearance contributing to persistently elevated levels.  We discussed chronic pancreatitis in clinic and literature was provided.     # GERD.  No endoscopic evidence for Barrett's on recent EGD done at time of EUS. Still with regurgitation despite high dose PPI and BID Pepcid. I would like to see if we can get him on the lowest effective dose of medication.  Continue pantoprazole 40 mg 30 minutes before breakfast. For now will try to hold evening dose of pantoprazole.  Continue Pepcid 20 mg but will change timing to before dinner and at bedtime since most of his symptoms occur in the evening Follow up in 6 months, sooner if needed. .   # Periampullary nodule (1 cm) .  This was found on recent EUS.  Biopsies showed inflammation, focal foveolar metaplasia and  reactive changes . No evidence of dysplasia or malignancy in the submitted specimen Unsure if this lesion will require future reevaluation or resection, will discuss with Dr. Tarri Glenn  # History of colon polyps. A 3 mm tubular adenoma was removed in April 2014 ( [facility]). Based on current polyp surveillance guidelines he should have a 7 to 10-year interval colonoscopy which would be sometime between April 2021 and April 2024.  Holding off scheduling him for a surveillance colonoscopy until I make sure with Dr. Tarri Glenn that he isn't also going to need a repeat EGD at some point to follow up on periampullary nodule. If  note then will arrange for polyp surveillance colonoscopy.    HPI   Mr. Bazen is a 76 year old male with a past medical history significant for GERD, fatty liver, prostate cancer , hepatic steatosis, RCC s/p right nephrectomy, CKD IV, chronic pancreatitis (recently diagnosed).   Mr. Marohl was seen here in January 2021 and again in February 2021 by Ellouise Newer, PA for evaluation of an elevated amylase and lipase. Non-contrast CT scan ordered and no pancreas findings were reported but it did show a scapular lesion and this was concerning given history of renal cell cancer.  Report was sent to his oncologist for follow-up.  We recommended follow-up labs in 3 months.  He returned in May 2021 at which time his amylase and lipase were still markedly elevated.   Plan was for him to come to the office to discuss EGD with EUS.  For unclear reasons that never happened, he was apparently lost to follow-up .    I saw the patient 10/12/2021, please refer to that office note for details . In summary I saw him for persistently elevated elevated amylase and lipase.   Following his appointment with me in February patient underwent MRCP which showed no evidence of acute pancreatitis or explanation for the elevated pancreatic enzymes.  There was mild chronic CBD and pancreatic duct dilation without obstructive stones or mass.  There was possible cholelithiasis.  He subsequently underwent an EUS by Dr. Ardis Hughs in June 2023 with findings of chronic pancreatitis and  a 1 cm periampullary nodule which was biopsied.  Biopsy showed inflammation, focal foveolar metaplasia and reactive changes.  No evidence for malignancy.  Serum IgG4 was normal. He has never consumed etoh.   Mr. Dissinger feels okay. He sometimes has loose stool when he makes vegetable smoothies.  Otherwise no loose stool.  His weight is stable. He does have a history of GERD. Often has regurgitation of food about an hour after eating dinner, occasionally this  happens during the day but certainly more prominent after dinner. He is taking Pepcid 20 mg BID  and protonix 40 mg BID    Previous GI Evaluation   Colonoscopy 2014 - [facility] Complete exam, good prep. Transverse colon polyp ( 3 mm) removed.    June 2023 EUS - Changes of chronic pancreatitis. - 1 cm periampullary nodule that does not show signs of deeper mass, no signs of involvement with the muscularis propria. I suspect this is a periampullary duodenal polyp, I biopsied it to exclude cancer. - Unclear if either of these findings explain his chronically elevated amylase and lipase (several hundreds). He has chronic renal insufficiency (Cr 3.7), perhaps that is playing a role?  A. MAJOR PAPILLA, BIOPSY:  - Ampullary mucosa with inflammation, focal foveolar metaplasia and  reactive changes  - No evidence of dysplasia or malignancy in the submitted specimen   Labs:     Latest Ref Rng & Units 10/12/2021    9:55 AM 09/08/2019   11:38 AM 12/07/2011    3:11 PM  CBC  WBC 4.0 - 10.5 K/uL 5.4  4.7  5.6   Hemoglobin 13.0 - 17.0 g/dL 10.2  11.8  13.5   Hematocrit 39.0 - 52.0 % 30.9  35.6  39.2   Platelets 150.0 - 400.0 K/uL 190.0  174.0  208        Latest Ref Rng & Units 09/08/2019   11:38 AM 12/07/2011    3:11 PM  Hepatic Function  Total Protein 6.0 - 8.3 g/dL 7.4  7.3   Albumin 3.5 - 5.2 g/dL 4.2  4.3   AST 0 - 37 U/L 18  28   ALT 0 - 53 U/L 22  33   Alk Phosphatase 39 - 117 U/L 69  56   Total Bilirubin 0.2 - 1.2 mg/dL 0.5  0.4      Past Medical History:  Diagnosis Date   Allergy    Cancer ([place])    prostate /renal ca   CKD (chronic kidney disease)    GERD (gastroesophageal reflux disease)    Hyperlipidemia    Hypertension    Renal insufficiency    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   T2DM (type 2 diabetes mellitus) ([place])     Past Surgical History:  Procedure Laterality Date   BIOPSY  01/26/2022   Procedure: BIOPSY;  Surgeon: Milus Banister, MD;  Location: [facility]  ENDOSCOPY;  Service: Gastroenterology;;   COLONOSCOPY  04/17/12   Poor prep   COLONOSCOPY  12/20/12   Repeat, 1 Polyp - needs 77yr f/u   ESOPHAGOGASTRODUODENOSCOPY (EGD) WITH PROPOFOL N/A 01/26/2022   Procedure: ESOPHAGOGASTRODUODENOSCOPY (EGD) WITH PROPOFOL;  Surgeon: Milus Banister, MD;  Location: [facility]OS[facility];  Service: Gastroenterology;  Laterality: N/A;   EUS N/A 01/26/2022   Procedure: UPPER ENDOSCOPIC ULTRASOUND (EUS) RADIAL;  Surgeon: Milus Banister, MD;  Location: [facility] ENDOSCOPY;  Service: Gastroenterology;  Laterality: N/A;   FOOT SURGERY     NEPHRECTOMY      Current  Medications, Allergies, Family History and Social History were reviewed in [employer] record.     Current Outpatient Medications  Medication Sig Dispense Refill   atorvastatin (LIPITOR) 40 MG tablet Take 40 mg by mouth at bedtime.     famotidine (PEPCID) 20 MG tablet Take 20 mg by mouth 2 (two) times daily.     furosemide (LASIX) 40 MG tablet Take 40 mg by mouth every other day.     hydrALAZINE (APRESOLINE) 100 MG tablet Take 100 mg by mouth 2 (two) times daily.     isosorbide mononitrate (IMDUR) 30 MG 24 hr tablet Take 30 mg by mouth daily.     Multiple Vitamins-Minerals (ALIVE MENS 50+ PO) Take 1 tablet by mouth daily.     OVER THE COUNTER MEDICATION Take 1 capsule by mouth 3 (three) times daily with meals. Kidney Factor (promotes kidney health)     pantoprazole (PROTONIX) 40 MG tablet Take 40 mg by mouth 2 (two) times daily.     primidone (MYSOLINE) 50 MG tablet Take 50 mg by mouth at bedtime.     propranolol ER (INDERAL LA) 80 MG 24 hr capsule Take 80 mg by mouth daily.     No current facility-administered medications for this visit.    Review of Systems: No chest pain. No shortness of breath. No urinary complaints.    Physical Exam  Wt Readings from Last 3 Encounters:  01/26/22 225 lb (102.1 kg)  10/12/21 229 lb (103.9 kg)  05/19/21 226 lb (102.5 kg)    BP 120/72   Pulse 87    Ht 6' (1.829 m)   Wt 223 lb (101.2 kg)   SpO2 96%   BMI 30.24 kg/m  Constitutional:  Generally well appearing male in no acute distress. Psychiatric: Pleasant. Normal mood and affect. Behavior is normal. EENT: Pupils normal.  Conjunctivae are normal. No scleral icterus. Neck supple.  Cardiovascular: Normal rate, regular rhythm. No edema Pulmonary/chest: Effort normal and breath sounds normal. No wheezing, rales or rhonchi. Abdominal: Soft, nondistended, nontender. Bowel sounds active throughout. There are no masses palpable. No hepatomegaly. Neurological: Alert and oriented to person place and time. Skin: Skin is warm and dry. No rashes noted.  Tye Savoy, NP  03/15/2022, 1:27 PM  Cc:  Sonia Side., FNP

## 2022-03-17 ENCOUNTER — Telehealth: Payer: Self-pay | Admitting: Nurse Practitioner

## 2022-03-17 NOTE — Telephone Encounter (Signed)
Inbound call from patient . Patient states at his previous OV he spoke about ''Stool Softner'', Patient would like a stool softner prescribed and sent to [facility] on [address]. Please give a call to further advise .  Thank you

## 2022-03-17 NOTE — Telephone Encounter (Signed)
Please advise 

## 2022-03-17 NOTE — Telephone Encounter (Signed)
Patient called and wanted to let you know that he did a Cologuard test about 2-3 months ago and it came back negative.  He forgot to mention that at his appointment with Nevin Bloodgood.  He is also very concerned about what the cost of doing both procedures will be to him.  He was encouraged to call his insurance company to find out what his portion would be.  Because the Cologuard was negative recently, he didn't know if he would still need to do both procedures.  Please call patient and advise.  Thank you.

## 2022-03-17 NOTE — Telephone Encounter (Signed)
Pt stated that PCP Dustin Folks, DNP at [facility] ordered a cologuard test that came back negative about 2-3 months ago. Pt wanted to know if he would still need a colonoscopy with this negative test result. See note below.

## 2022-03-20 ENCOUNTER — Telehealth: Payer: Self-pay

## 2022-03-20 NOTE — Telephone Encounter (Signed)
Spoke with pt and let him know recommendations. Pt verbalized understanding.

## 2022-03-20 NOTE — Telephone Encounter (Signed)
Spoke with pt and let him know recommendations. Pt agreed to colonoscopy but also had questions regarding billing. Let pt know he could contact his insurance company for further details about insurance coverage.

## 2022-03-20 NOTE — Telephone Encounter (Signed)
Spoke with pt and let him know recommendations. Pt agreed to EGD and colon in 3-4 months. Recall placed for October procedure.

## 2022-03-20 NOTE — Telephone Encounter (Signed)
-----   Message from Willia Craze, NP sent at 03/20/2022  8:26 AM EDT ----- Good morning,  Please let patient know that I did discuss EGD with Dr. 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 Will you let him know and please get this scheduled for sometime in late October with Dr. Tarri Glenn. If he wants to proceed with polyp surveillance at that time then will need a double. Thanks

## 2022-04-12 ENCOUNTER — Ambulatory Visit (HOSPITAL_COMMUNITY): Admission: RE | Admit: 2022-04-12 | Payer: Medicare HMO | Source: Ambulatory Visit

## 2022-04-19 ENCOUNTER — Telehealth: Payer: Self-pay | Admitting: Cardiology

## 2022-04-19 NOTE — Telephone Encounter (Signed)
Pt called stating that his PCP told him to call us and ask for a code for a stress test and that Dr. Debara Pickett would be doing it. He states that it supposed to be a treadmill test. Im assuming he is requesting an order be put in for a stress test, but not sure.    PCP office is [facility] on [address] [place]. [phone]

## 2022-04-19 NOTE — Telephone Encounter (Signed)
Spoke with Medical sales representative at [facility]. She stated patient is not scheduled for any tests. She will contact patient

## 2022-04-22 ENCOUNTER — Emergency Department (HOSPITAL_COMMUNITY)
Admission: EM | Admit: 2022-04-22 | Discharge: 2022-04-23 | Disposition: A | Discharge status: Home / Self Care | Payer: Medicare HMO | Attending: Emergency Medicine | Admitting: Emergency Medicine

## 2022-04-22 DIAGNOSIS — R7989 Other specified abnormal findings of blood chemistry: Secondary | ICD-10-CM | POA: Diagnosis not present

## 2022-04-22 DIAGNOSIS — R0789 Other chest pain: Secondary | ICD-10-CM | POA: Insufficient documentation

## 2022-04-22 DIAGNOSIS — I129 Hypertensive chronic kidney disease with stage 1 through stage 4 chronic kidney disease, or unspecified chronic kidney disease: Secondary | ICD-10-CM | POA: Diagnosis not present

## 2022-04-22 DIAGNOSIS — R079 Chest pain, unspecified: Secondary | ICD-10-CM | POA: Diagnosis present

## 2022-04-22 DIAGNOSIS — Z79899 Other long term (current) drug therapy: Secondary | ICD-10-CM | POA: Diagnosis not present

## 2022-04-22 DIAGNOSIS — I2510 Atherosclerotic heart disease of native coronary artery without angina pectoris: Secondary | ICD-10-CM | POA: Insufficient documentation

## 2022-04-22 DIAGNOSIS — N184 Chronic kidney disease, stage 4 (severe): Secondary | ICD-10-CM | POA: Diagnosis not present

## 2022-04-23 ENCOUNTER — Other Ambulatory Visit: Payer: Self-pay

## 2022-04-23 ENCOUNTER — Emergency Department (HOSPITAL_COMMUNITY): Payer: Medicare HMO

## 2022-04-23 ENCOUNTER — Encounter (HOSPITAL_COMMUNITY): Payer: Self-pay | Admitting: *Deleted

## 2022-04-23 LAB — CBC
HCT: 27.5 % — ABNORMAL LOW (ref 39.0–52.0)
Hemoglobin: 9.3 g/dL — ABNORMAL LOW (ref 13.0–17.0)
MCH: 33.3 pg (ref 26.0–34.0)
MCHC: 33.8 g/dL (ref 30.0–36.0)
MCV: 98.6 fL (ref 80.0–100.0)
Platelets: 177 10*3/uL (ref 150–400)
RBC: 2.79 MIL/uL — ABNORMAL LOW (ref 4.22–5.81)
RDW: 13.7 % (ref 11.5–15.5)
WBC: 6 10*3/uL (ref 4.0–10.5)
nRBC: 0 % (ref 0.0–0.2)

## 2022-04-23 LAB — BASIC METABOLIC PANEL
Anion gap: 9 (ref 5–15)
BUN: 66 mg/dL — ABNORMAL HIGH (ref 8–23)
CO2: 15 mmol/L — ABNORMAL LOW (ref 22–32)
Calcium: 8.8 mg/dL — ABNORMAL LOW (ref 8.9–10.3)
Chloride: 111 mmol/L (ref 98–111)
Creatinine, Ser: 4.17 mg/dL — ABNORMAL HIGH (ref 0.61–1.24)
GFR, Estimated: 14 mL/min — ABNORMAL LOW (ref >60)
Glucose, Bld: 95 mg/dL (ref 70–99)
Potassium: 4.9 mmol/L (ref 3.5–5.1)
Sodium: 135 mmol/L (ref 135–145)

## 2022-04-23 LAB — TROPONIN I (HIGH SENSITIVITY)
Troponin I (High Sensitivity): 5 ng/L (ref <18)
Troponin I (High Sensitivity): 6 ng/L (ref <18)

## 2022-04-23 NOTE — ED Provider Triage Note (Signed)
Emergency Medicine Provider Triage Evaluation Note  [name] , a 76 y.o. male  was evaluated in triage.  Pt complains of left-sided chest pain be going on for months, states is gotten slightly worse since Wednesday, no other symptoms..  Review of Systems  Positive: Chest pain, Negative: Shortness of breath, peripheral edema  Physical Exam  BP 136/68 (BP Location: Right Arm)   Pulse 73   Temp 98.6 F (37 C) (Oral)   Resp 17   SpO2 98%  Gen:   Awake, no distress   Resp:  Normal effort  MSK:   Moves extremities without difficulty  Other:    Medical Decision Making  Medically screening exam initiated at 12:42 AM.  Appropriate orders placed.  [name] was informed that the remainder of the evaluation will be completed by another provider, this initial triage assessment does not replace that evaluation, and the importance of remaining in the ED until their evaluation is complete.  Chest pain lab work imaging of been ordered will need further work-up.   Marcello Fennel, PA-C 04/23/22 [phone]

## 2022-04-23 NOTE — Discharge Instructions (Addendum)
You were seen today for chest pain.  Your blood work and imaging studies are reassuring and do not show any signs of strain on the heart.  Your most recent creatinine was 4.17.  You may take this value your kidney doctors (nephrology) for follow-up.  Please follow-up with your primary care doctor in the next 2 to 3 days regarding today's visit and your symptoms. Please return to the emergency department if you experience any chest pain, difficulty breathing, abdominal pain, altered mental status, severe fever, or any other concerning symptom.  Thank you for allowing [place] to participate in your care.

## 2022-04-23 NOTE — ED Provider Notes (Signed)
Supervise resident visit.  Patient here left-sided chest pain for the past week.  May be related to eating.  Has had this pain on and off for 2 years.  Per chart review patient had a coronary CT scan done last year that was normal.  0 percentile for CAD.  He has a history of acid reflux, hypertension.  Overall he appears well.  No PE risk factors.  Wells criteria 0.  Differential diagnosis is less likely ACS and more likely reflux or MSK pain.  Could be pneumonia but he does not have any infectious symptoms.  EKG, CBC, BMP, troponin x2, chest x-ray have been ordered and completed.  Per my review and interpretation of these labs and images and EKG, there is no significant findings.  EKG shows sinus rhythm.  No ischemic changes.  Troponin negative x2.  Doubt ACS.  Chest x-ray per my review and interpretation shows no pneumonia or pneumothorax.  Patient has baseline creatinine between 3.6 and 4.2.  Known chronic kidney disease and follows with nephrology.  Otherwise there is no significant anemia or electrolyte abnormality or leukocytosis.  Overall given reassurance.  Recommend follow-up with primary care doctor.  Discharged in good condition.  This chart was dictated using voice recognition software.  Despite best efforts to proofread,  errors can occur which can change the documentation meaning.    Lennice Sites, DO 04/23/22 [phone]

## 2022-04-23 NOTE — ED Triage Notes (Signed)
Pt says on Wednesday he was on the treadmill for just a couple of minutes and he started having increased pain in his left chest. Pt says he often has pain in his chest, but it has been getting worse. Today, the chest pain has been worse all day. Denies SOB, dizziness, back pain or any other symptoms.

## 2022-04-23 NOTE — ED Provider Notes (Signed)
[name] Provider Note   CSN: [id number] Arrival date & time: 04/22/22  2303     History  Chief Complaint  Patient presents with   Chest Pain    [name] is a 76 y.o. male PMH GERD, hypertension, CAD, RCC s/p right nephrectomy, CKD stage 4, chronic pancreatitis who is presenting with chest pain.  Patient reports he was on the treadmill walking on Wednesday, when he noticed left-sided chest pain.  He stopped walking, but the pain persisted, which was different than normal for him.  He is able to put 2 fingers on the pain, which is along the left side of his chest.  It does not radiate.  It is not associated with nausea, vomiting, diaphoresis, shortness of breath.  It has been constant since Wednesday, and patient rates its worse severity at 2 out of 10.  He reports currently it is improving, and is only a 1 out of 10.  Denies any fever, cough, congestion, abdominal pain.  Home Medications Prior to Admission medications   Medication Sig Start Date End Date Taking? Authorizing Provider  atorvastatin (LIPITOR) 40 MG tablet Take 40 mg by mouth at bedtime.    [provider]  famotidine (PEPCID) 20 MG tablet Take 20 mg by mouth 2 (two) times daily.    [provider]  furosemide (LASIX) 40 MG tablet Take 40 mg by mouth every other day.    [provider]  hydrALAZINE (APRESOLINE) 100 MG tablet Take 100 mg by mouth 2 (two) times daily.    [provider]  isosorbide mononitrate (IMDUR) 30 MG 24 hr tablet Take 30 mg by mouth daily.    [provider]  Multiple Vitamins-Minerals (ALIVE MENS 50+ PO) Take 1 tablet by mouth daily.    [provider]  OVER THE COUNTER MEDICATION Take 1 capsule by mouth 3 (three) times daily with meals. Kidney Factor (promotes kidney health)    [provider]  pantoprazole (PROTONIX) 40 MG tablet Take 40 mg by mouth 2 (two) times daily.    [provider]  primidone (MYSOLINE) 50 MG tablet Take 50 mg by mouth at bedtime. 09/01/21   [provider]  propranolol ER (INDERAL LA) 80 MG 24 hr capsule Take 80 mg by mouth daily. 09/14/21   [provider]      Allergies    Patient has no known allergies.    Review of Systems   Review of Systems  Cardiovascular:  Positive for chest pain.    Physical Exam Updated Vital Signs BP (!) 144/77 (BP Location: Right Arm)   Pulse 65   Temp 98 F (36.7 C) (Oral)   Resp 18   SpO2 100%    Physical Exam Vitals and nursing note reviewed.  Constitutional:      General: He is not in acute distress.    Appearance: He is well-developed. He is obese. He is not ill-appearing or diaphoretic.  HENT:     Head: Normocephalic and atraumatic.  Eyes:     Conjunctiva/sclera: Conjunctivae normal.  Cardiovascular:     Rate and Rhythm: Normal rate and regular rhythm.     Heart sounds: No murmur heard. Pulmonary:     Effort: Pulmonary effort is normal. No tachypnea, accessory muscle usage or respiratory distress.     Breath sounds: Normal breath sounds.  Chest:     Chest wall: No tenderness.  Abdominal:     Palpations: Abdomen is soft.  Tenderness: There is no abdominal tenderness. There is no guarding.  Musculoskeletal:        General: No swelling.     Cervical back: Neck supple.  Skin:    General: Skin is warm and dry.  Neurological:     Mental Status: He is alert.    ED Results / Procedures / Treatments   Labs (all labs ordered are listed, but only abnormal results are displayed) Labs Reviewed  BASIC METABOLIC PANEL - Abnormal; Notable for the following components:      Result Value   CO2 15 (*)    BUN 66 (*)    Creatinine, Ser 4.17 (*)    Calcium 8.8 (*)    GFR, Estimated 14 (*)    All other components within normal limits  CBC - Abnormal; Notable for the following components:   RBC 2.79 (*)    Hemoglobin 9.3 (*)    HCT 27.5 (*)    All other components  within normal limits  TROPONIN I (HIGH SENSITIVITY)  TROPONIN I (HIGH SENSITIVITY)    EKG EKG Interpretation  Date/Time:  Saturday April 22 2022 23:36:58 EDT Ventricular Rate:  70 PR Interval:  172 QRS Duration: 88 QT Interval:  378 QTC Calculation: 408 R Axis:   -23 Text Interpretation: Normal sinus rhythm Normal ECG When compared with ECG of 15-Jan-2008 13:18, PREVIOUS ECG IS PRESENT Confirmed by Lennice Sites ([id number]) on 04/23/2022 7:11:19 AM  Radiology DG Chest 2 View  Result Date: 04/23/2022 CLINICAL DATA:  Left-sided chest pain, no known injury, initial encounter EXAM: CHEST - 2 VIEW COMPARISON:  06/07/2007 FINDINGS: Cardiac shadow is within normal limits. Aortic calcifications are noted. Lungs are clear. Degenerative changes of the thoracic spine are noted. IMPRESSION: No acute abnormality noted. Electronically Signed   By: Inez Catalina M.D.   On: 04/23/2022 00:36    Procedures Procedures    Medications Ordered in ED Medications - No data to display  ED Course/ Medical Decision Making/ A&P                           Medical Decision Making Amount and/or Complexity of Data Reviewed Labs: ordered. Radiology: ordered.   [name] is a 76 y.o. male PMH GERD, hypertension, CAD, RCC s/p right nephrectomy, CKD stage 4, chronic pancreatitis who is presenting with chest pain that has been going on for years, however worsened 4 days ago.   Vitals at presentation within normal limits.  Patient is hemodynamically stable, afebrile, satting well on room air.  Physical exam reassuring.  Normal heart sounds.  Lungs clear to auscultation bilaterally.  Abdomen is soft, nontender to palpation.  No pitting edema.  Initial differential includes but is not limited to: ACS, N/STEMI, costochondritis, musculoskeletal chest pain, pneumonia, pneumothorax, pleurisy, viral URI, PE  Lab work obtained, resulted notable for CBC without leukocytosis.  Hemoglobin 9.3, consistent with prior.   BMP notable for low bicarb of 15; patient overall appears well, no recent labs available for comparison.  BUN and creatinine elevated at 66 and 4.17.  Patient has known chronic kidney disease, follows with nephrology.  No anion gap. Troponin negative x2.    Chest x-ray without evidence of cardiomegaly, pleural effusion, pneumothorax.  Per radiology report, no acute abnormality noted.  EKG obtained, demonstrates sinus rhythm, ventricular rate 70 bpm.  No evidence of abnormal intervals or acute ischemia.  Interpreted by myself and my attending.  Patient reports improvement in his chest  pain, which is well localized, does not radiate, has been constant since Wednesday.  Hear score 3.  Chest pain not pleuritic.  Unable to [facility] given patient's age, however Wells score 0.  ACS, unlikely PE.  Patient underwent coronary calcium score in August 2022, which resulted at 0, which study reports "suggest low risk for future cardiac events".  Given reassuring findings on physical exam, vitals, I believe that the patient is safe and stable for discharge at this time. Return precautions provided and a plan for follow-up care in place as needed.  The plan for this patient was discussed with Dr. Ronnald Nian, who voiced agreement and who oversaw evaluation and treatment of this patient.    Final Clinical Impression(s) / ED Diagnoses Final diagnoses:  Other chest pain    Rx / DC Orders ED Discharge Orders     None         Faylene Million, MD 04/23/22 [place], [place], DO 04/23/22 [id number]

## 2022-04-24 ENCOUNTER — Telehealth: Payer: Self-pay

## 2022-04-24 NOTE — Telephone Encounter (Signed)
NOTES SCANNED TO REFERRAL 

## 2022-05-11 ENCOUNTER — Encounter (HOSPITAL_BASED_OUTPATIENT_CLINIC_OR_DEPARTMENT_OTHER): Payer: Self-pay | Admitting: Cardiology

## 2022-05-11 ENCOUNTER — Ambulatory Visit (HOSPITAL_BASED_OUTPATIENT_CLINIC_OR_DEPARTMENT_OTHER): Payer: Medicare HMO | Admitting: Cardiology

## 2022-05-11 VITALS — BP 118/60 | HR 82 | Ht 72.0 in | Wt 224.3 lb

## 2022-05-11 DIAGNOSIS — N185 Chronic kidney disease, stage 5: Secondary | ICD-10-CM | POA: Diagnosis not present

## 2022-05-11 DIAGNOSIS — I10 Essential (primary) hypertension: Secondary | ICD-10-CM

## 2022-05-11 DIAGNOSIS — R079 Chest pain, unspecified: Secondary | ICD-10-CM

## 2022-05-11 DIAGNOSIS — R600 Localized edema: Secondary | ICD-10-CM | POA: Diagnosis not present

## 2022-05-11 DIAGNOSIS — Z7189 Other specified counseling: Secondary | ICD-10-CM

## 2022-05-11 NOTE — Patient Instructions (Signed)
Medication Instructions:  Your Physician recommend you continue on your current medication as directed.    *If you need a refill on your cardiac medications before your next appointment, please call your pharmacy*   Lab Work: None ordered today   Testing/Procedures: None ordered today   Follow-Up: At [facility], you and your health needs are our priority.  As part of our continuing mission to provide you with exceptional heart care, we have created designated Provider Care Teams.  These Care Teams include your primary Cardiologist (physician) and Advanced Practice Providers (APPs -  Physician Assistants and Nurse Practitioners) who all work together to provide you with the care you need, when you need it.  We recommend signing up for the patient portal called "MyChart".  Sign up information is provided on this After Visit Summary.  MyChart is used to connect with patients for Virtual Visits (Telemedicine).  Patients are able to view lab/test results, encounter notes, upcoming appointments, etc.  Non-urgent messages can be sent to your provider as well.   To learn more about what you can do with MyChart, go to [email or web address].    Your next appointment:   As needed  The format for your next appointment:   In Person  Provider:   Bridgette Christopher, MD           

## 2022-05-11 NOTE — Progress Notes (Signed)
Cardiology Office Note:    Date:  05/11/2022   ID:  [name], DOB 04-16-1946, MRN [id number]  PCP:  Sonia Side., FNP  Cardiologist:  None  Referring MD: Sonia Side., FNP   No chief complaint on file.   History of Present Illness:    [name] is a 76 y.o. male with a hx of *** who is seen as a new consult at the request of Sonia Side., FNP for the evaluation and management of chest pain.  Today:  He had been experiencing chest pains for over 2 years. Dr. Tamala Julian has given him some medication at that time because the pain was slowly increasing. Started at a 1/10 but two weeks ago he went to the ER because of an episode of worsening pain. Two days prior to this ER visit he walked about 2 miles on the treadmill and did some other exercises over about a hours time. The next day at the gym he was only there for 2 minutes and he had to leave due to the pain. He went to lay down for about an hour and felt better initially. That night he had to go to the ER because the pain increased to a 2/10. By the time the Dr came to examine him after his testing the pain had decreased to 1/10 again.   The pain does not radiate anywhere and is not sharp but it is a steady pain in the chest. Nothing seems to make the pain better or worse. No history of injury to the chest. There is no pain when he touches it. The position of the pain was the same during his ER visit but it was stronger. The pain does not seem to go away very often and if it does it comes back fairly quickly.   He has peripheral edema on his R leg below the knee. Usually the edema will get worse after he eats a meal that has a lot of fat or salt but then will go down after a couple days of eating normally. It has been swelling more the past couple months and has not been going down like it normally would. He sees Dr. Nyoka Cowden for his kidneys who has recommended he drink water with half a teaspoon of baking soda when the  edema gets worse. He has been following this recommendation but it has not improved his edema. His left leg is also somewhat swollen today though not as bad as the right.   He had lost 75 lbs recently at the recommendations of his PCP. He was told recently he needs to lose an additional 10-15 lbs. Today in office he is down another 5 lbs. He tries to watch his diet to limit fat and salt but does have some days where he consumes more. He avoids soda except for some ginger ale in a 7 oz can about once a month. He sometimes drinks gatorade and tries to stick to clear fluids.   He monitors his urine output and is averaging about 2 quarts a day. He used to drink about a gallon of water a day but he decreased this at the recommendation of Dr. Nyoka Cowden due to only having one kidney.   He denies any palpitations or shortness of breath. No lightheadedness, headaches, syncope, orthopnea, or PND.   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   T2DM (type 2 diabetes mellitus) ([place])     Past Surgical History:  Procedure Laterality Date   BIOPSY  01/26/2022   Procedure: BIOPSY;  Surgeon: Milus Banister, MD;  [facility];  Service: Gastroenterology;;   COLONOSCOPY  04/17/12   Poor prep   COLONOSCOPY  12/20/12   Repeat, 1 Polyp - needs 53yrf/u   ESOPHAGOGASTRODUODENOSCOPY (EGD) WITH PROPOFOL N/A 01/26/2022   Procedure: ESOPHAGOGASTRODUODENOSCOPY (EGD) WITH PROPOFOL;  Surgeon: JMilus Banister MD;  [facility];  Service: Gastroenterology;  Laterality: N/A;   EUS N/A 01/26/2022   Procedure: UPPER ENDOSCOPIC ULTRASOUND (EUS) RADIAL;  Surgeon: JMilus Banister MD;  [facility];  Service:  Gastroenterology;  Laterality: N/A;   FOOT SURGERY     NEPHRECTOMY      Current Medications: Current Outpatient Medications on File Prior to Visit  Medication Sig   atorvastatin (LIPITOR) 40 MG tablet Take 40 mg by mouth at bedtime.   famotidine (PEPCID) 20 MG tablet Take 20 mg by mouth 2 (two) times daily.   furosemide (LASIX) 40 MG tablet Take 40 mg by mouth every other day.   hydrALAZINE (APRESOLINE) 100 MG tablet Take 100 mg by mouth 2 (two) times daily.   isosorbide mononitrate (IMDUR) 30 MG 24 hr tablet Take 30 mg by mouth daily.   Multiple Vitamins-Minerals (ALIVE MENS 50+ PO) Take 1 tablet by mouth daily.   OVER THE COUNTER MEDICATION Take 1 capsule by mouth 3 (three) times daily with meals. Kidney Factor (promotes kidney health)   pantoprazole (PROTONIX) 40 MG tablet Take 40 mg by mouth 2 (two) times daily.   primidone (MYSOLINE) 50 MG tablet Take 50 mg by mouth at bedtime.   propranolol ER (INDERAL LA) 80 MG 24 hr capsule Take 80 mg by mouth daily.   No current facility-administered medications on file prior to visit.     Allergies:   Patient has no known allergies.   Social History   Tobacco Use   Smoking status: Never   Smokeless tobacco: Never  Vaping Use   Vaping Use: Never used  Substance Use Topics   Alcohol use: Never   Drug use: Never    Family History: family history includes Asthma in his mother; Cancer in his maternal grandfather and maternal grandmother. There is no history of Colon cancer, Stomach cancer, or Esophageal cancer.  ROS:   Please see the history of present illness.  Additional pertinent ROS: Constitutional: Negative for chills, fever, night sweats, unintentional weight loss  HENT: Negative for ear pain and hearing loss.   Eyes: Negative for loss of vision and eye pain.  Respiratory: Negative for cough, sputum, wheezing.   Cardiovascular: See HPI. Gastrointestinal: Negative for abdominal pain, melena, and hematochezia.  Genitourinary:  Negative for dysuria and hematuria.  Musculoskeletal: Negative for falls and myalgias.  Skin: Negative for itching and rash.  Neurological: Negative for focal weakness, focal sensory changes and loss of consciousness.  Endo/Heme/Allergies: Does not bruise/bleed easily.     EKGs/Labs/Other Studies Reviewed:    The following studies were reviewed today:  Coronary Calcium Scoring 04/18/2021: IMPRESSION: Coronary calcium score of 0 Agatston units. This suggests low risk for future cardiac events  Echo 04/18/2021:  IMPRESSIONS    1. Left ventricular ejection fraction, by estimation, is 65 to 70%. The  left ventricle has normal function. The left ventricle has no regional  wall motion abnormalities. The left ventricular internal cavity size was  mildly dilated. Left ventricular  diastolic parameters were normal.   2. Right ventricular systolic function is normal. The right ventricular  size is normal.   3. The mitral valve is normal in structure. No evidence of mitral valve  regurgitation.   4. The aortic valve is normal in structure. Aortic valve regurgitation is  not visualized.   5. The inferior vena cava is normal in size with greater than 50%  respiratory variability, suggesting right atrial pressure of 3 mmHg.   Stress Test 03/24/2022: Nuclear stress EF: 66%. The left ventricular ejection fraction is hyperdynamic (>65%). There was no ST segment deviation noted during stress. No T wave inversion was noted during stress. Defect 1: There is a medium defect present in the basal inferior, mid inferior and apical inferior location consistent with diaphragmatic attenuation artifact. The study is normal. This is a low risk study with no evidence of ischemia.   Finalized by Skeet Latch, MD on Thu Mar 24, 2021  3:21 PM Nuclear stress EF: 66%. The left ventricular ejection fraction is hyperdynamic (>65%). There was no ST segment deviation noted during stress. No T wave inversion  was noted during stress. Defect 1: There is a medium defect present in the basal inferior, mid inferior and apical inferior location. The study is normal. This is a low risk study with no evidence of ischemia.    EKG:  EKG is personally reviewed.   05/11/2022: ***  Recent Labs: 04/23/2022: BUN 66; Creatinine, Ser 4.17; Hemoglobin 9.3; Platelets 177; Potassium 4.9; Sodium 135  Recent Lipid Panel    Component Value Date/Time   LDLDIRECT 206 (H) 02/26/2013 1112    Physical Exam:    VS:  BP 118/60 (BP Location: Right Arm, Patient Position: Sitting, Cuff Size: Large)   Pulse 82   Ht 6' (1.829 m)   Wt 224 lb 4.8 oz (101.7 kg)   BMI 30.42 kg/m     Wt Readings from Last 3 Encounters:  05/11/22 224 lb 4.8 oz (101.7 kg)  03/15/22 223 lb (101.2 kg)  01/26/22 225 lb (102.1 kg)    GEN: Well nourished, well developed in no acute distress HEENT: Normal, moist mucous membranes NECK: No JVD CARDIAC: regular rhythm, normal S1 and S2, no rubs or gallops. No murmur. VASCULAR: Radial and DP pulses 2+ bilaterally. No carotid bruits RESPIRATORY:  Clear to auscultation without rales, wheezing or rhonchi  ABDOMEN: Soft, non-tender, non-distended MUSCULOSKELETAL:  Ambulates independently SKIN: Warm and dry, *** firm LE edema R greater than L NEUROLOGIC:  Alert and oriented x 3. No focal neuro deficits noted. PSYCHIATRIC:  Normal affect    ASSESSMENT:    No diagnosis found. PLAN:     Cardiac risk counseling and prevention recommendations: -recommend heart healthy/Mediterranean diet, with whole grains, fruits, vegetable, fish, lean meats, nuts, and olive oil. Limit salt. -recommend moderate walking, 3-5 times/week for 30-50 minutes each session. Aim for at least 150 minutes.week. Goal should be pace of 3 miles/hours, or walking 1.5 miles in 30 minutes -recommend avoidance of tobacco products. Avoid excess alcohol. -ASCVD risk score: The ASCVD Risk score (Arnett DK, et al., 2019) failed to  calculate for the following reasons:   Cannot find a previous HDL lab   Cannot find a previous total cholesterol  lab    Plan for follow up:PRN  Buford Dresser, MD, PhD, [facility]    Medication Adjustments/Labs and Tests Ordered: Current medicines are reviewed at length with the patient today.  Concerns regarding medicines are outlined above.  No orders of the defined types were placed in this encounter.  No orders of the defined types were placed in this encounter.   There are no Patient Instructions on file for this visit.    I,Jessica Ford,acting as a Education administrator for [employer], MD.,have documented all relevant documentation on the behalf of Buford Dresser, MD,as directed by  Buford Dresser, MD while in the presence of Buford Dresser, MD.   *** Signed, Buford Dresser, MD PhD 05/11/2022 10:53 AM    [place]

## 2022-05-12 ENCOUNTER — Telehealth: Payer: Self-pay | Admitting: Cardiology

## 2022-05-12 ENCOUNTER — Encounter (HOSPITAL_BASED_OUTPATIENT_CLINIC_OR_DEPARTMENT_OTHER): Payer: Self-pay | Admitting: Cardiology

## 2022-05-12 NOTE — Telephone Encounter (Signed)
Left message to call back.  05/12/2022 TDS

## 2022-05-12 NOTE — Telephone Encounter (Signed)
  Pt would like to clarify if he needs to get a treadmill stress test.

## 2022-05-15 NOTE — Telephone Encounter (Signed)
Left message for patient to call back  

## 2022-05-16 NOTE — Telephone Encounter (Signed)
3rd call attempt, no answer, unable to leave message. Removing from triage basket.

## 2022-05-19 ENCOUNTER — Telehealth: Payer: Self-pay

## 2022-05-19 ENCOUNTER — Ambulatory Visit: Payer: Medicare HMO | Admitting: Acute Care

## 2022-05-19 ENCOUNTER — Encounter: Payer: Self-pay | Admitting: Acute Care

## 2022-05-19 VITALS — BP 130/80 | HR 97 | Temp 97.8°F | Ht 72.0 in | Wt 225.8 lb

## 2022-05-19 DIAGNOSIS — G8929 Other chronic pain: Secondary | ICD-10-CM

## 2022-05-19 DIAGNOSIS — R911 Solitary pulmonary nodule: Secondary | ICD-10-CM

## 2022-05-19 DIAGNOSIS — R0789 Other chest pain: Secondary | ICD-10-CM

## 2022-05-19 NOTE — Patient Instructions (Addendum)
It is good to see you today. We will schedule your 1 year  CT chest within the next 2 weeks. We will have you come in for follow up after the scan has been done  the week of 10/17 Try tylenol one 500 mg tablet for the chest discomfort you are feeling.  Try this for a week, and if it makes no difference you can stop. We can look at the area on CT once it is done.  Please contact office for sooner follow up if symptoms do not improve or worsen or seek emergency care

## 2022-05-19 NOTE — Telephone Encounter (Signed)
ATC LVMTCB r/t OV today with Eric Form. Pt needs to reschedule after CT has been completed. Will route this note to [facility] pool as well so they can schedule CT.

## 2022-05-19 NOTE — Progress Notes (Signed)
History of Present Illness [name] is a 76 y.o. male with past medical history of Prostate cancer diagnosed in 2014, renal cancer status post right nephrectomy in 2007.  Patient had an incidentally found a small 3 mm lung nodule.  He is a lifelong non-smoker. No family history of lung cancer.  Patient was referred to Dr. Valeta [name] for evaluation of a small 3 mm subpleural lesion found on the right lung.    05/19/2022 Pt. Presents for follow up of 3 mm right upper lobe sub pleural lung nodule found incidentally . Because of his PMH od prostate cancer , and renal cancer, paln was to follow this nodule annually to ensure it is stable. The scan was ordered 03/2021, but it was never done. We did try calling the patient today to let him know since we did not have scan results , we could reschedule the OV for after the scan was completed. The patient wanted to be seen however, for a chronic left sided chest pain he has had for about 2 years.It does not radiate.  It is not associated with nausea, vomiting, diaphoresis, shortness of breath. He was seen at [facility] 04/22/2022 . This pain was worked up, he was referred to cardiology 05/11/2022 who saw him and ruled out a cardiology etiology, and did not recommend any further cardiac testing at that time. He states that the pain is dull and on a pain scale it is < 1. He denies fever, hemoptysis of dyspnea. No back or leg pain, no recent travel. HIs Wells score was noted in the ED to be   He is asymptomatic for any breathing issues. He is a never smoker . We will re-order CT Chest ( Scheduled for 05/30/2022) , and see him in the office 10/17 2023 to review the results with him.    Test Results:     Latest Ref Rng & Units 04/23/2022   12:18 AM 10/12/2021    9:55 AM 09/08/2019   11:38 AM  CBC  WBC 4.0 - 10.5 K/uL 6.0  5.4  4.7   Hemoglobin 13.0 - 17.0 g/dL 9.3  10.2  11.8   Hematocrit 39.0 - 52.0 % 27.5  30.9  35.6   Platelets 150 - 400 K/uL 177  190.0   174.0        Latest Ref Rng & Units 04/23/2022   12:18 AM 09/08/2019   11:38 AM 12/07/2011    3:11 PM  BMP  Glucose 70 - 99 mg/dL 95  152  76   BUN 8 - 23 mg/dL 66  50  23   Creatinine 0.61 - 1.24 mg/dL 4.17  3.64  1.90   Sodium 135 - 145 mmol/L 135  137  135   Potassium 3.5 - 5.1 mmol/L 4.9  4.4  4.7   Chloride 98 - 111 mmol/L 111  108  106   CO2 22 - 32 mmol/L '15  20  18   '$ Calcium 8.9 - 10.3 mg/dL 8.8  9.4  9.7     BNP No results found for: "BNP"  ProBNP No results found for: "PROBNP"  PFT No results found for: "FEV1PRE", "FEV1POST", "FVCPRE", "FVCPOST", "TLC", "DLCOUNC", "PREFEV1FVCRT", "PSTFEV1FVCRT"  DG Chest 2 View  Result Date: 04/23/2022 CLINICAL DATA:  Left-sided chest pain, no known injury, initial encounter EXAM: CHEST - 2 VIEW COMPARISON:  06/07/2007 FINDINGS: Cardiac shadow is within normal limits. Aortic calcifications are noted. Lungs are clear. Degenerative changes of the thoracic spine are  noted. IMPRESSION: No acute abnormality noted. Electronically Signed   By: Inez Catalina M.D.   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 He has never used smokeless tobacco. He reports that he does not drink alcohol and does not use drugs.  Tobacco Cessation: Never smoker    Past surgical hx, Family hx, Social hx all reviewed.  Current Outpatient Medications on File Prior to Visit  Medication Sig   atorvastatin (LIPITOR) 40  MG tablet Take 40 mg by mouth at bedtime.   famotidine (PEPCID) 20 MG tablet Take 20 mg by mouth 2 (two) times daily.   furosemide (LASIX) 40 MG tablet Take 40 mg by mouth every other day.   hydrALAZINE (APRESOLINE) 100 MG tablet Take 100 mg by mouth 2 (two) times daily.   isosorbide mononitrate (IMDUR) 30 MG 24 hr tablet Take 30 mg by mouth daily.   Multiple Vitamins-Minerals (ALIVE MENS 50+ PO) Take 1 tablet by mouth daily.   OVER THE COUNTER MEDICATION Take 1 capsule by mouth 3 (three) times daily with meals. Kidney Factor (promotes kidney health)   pantoprazole (PROTONIX) 40 MG tablet Take 40 mg by mouth 2 (two) times daily.   primidone (MYSOLINE) 50 MG tablet Take 50 mg by mouth at bedtime.   propranolol ER (INDERAL LA) 80 MG 24 hr capsule Take 80 mg by mouth daily.   No current facility-administered medications on file prior to visit.     No Known Allergies  Review Of Systems:  Constitutional:   No  weight loss, night sweats,  Fevers, chills, fatigue, or  lassitude.  HEENT:   No headaches,  Difficulty swallowing,  Tooth/dental problems, or  Sore throat,                No sneezing, itching, ear ache, nasal congestion, post nasal drip,   CV:  + dull chronic L  chest pain,  No Orthopnea, PND, swelling in lower extremities, anasarca, dizziness, palpitations, syncope.   GI  No heartburn, indigestion, abdominal pain, nausea, vomiting, diarrhea, change in bowel habits, loss of appetite, bloody stools.   Resp: No shortness of breath with exertion or at rest.  No excess mucus, no productive cough,  No non-productive cough,  No coughing up of blood.  No change in color of mucus.  No wheezing.  No chest wall deformity  Skin: no rash or lesions.  GU: no dysuria, change in color of urine, no urgency or frequency.  No flank pain, no hematuria   MS:  No joint pain or swelling.  No decreased range of motion.  No back pain.  Psych:  No change in mood or affect. No depression or anxiety.  No  memory loss.   Vital Signs BP 130/80 (BP Location: Left Arm, Patient Position: Sitting, Cuff  Size: Normal)   Pulse 97   Temp 97.8 F (36.6 C) (Oral)   Ht 6' (1.829 m)   Wt 225 lb 12.8 oz (102.4 kg)   SpO2 97%   BMI 30.62 kg/m    Physical Exam:  General- No distress,  A&Ox3, pleasant  ENT: No sinus tenderness, TM clear, pale nasal mucosa, no oral exudate,no post nasal drip, no LAN Cardiac: S1, S2, regular rate and rhythm, no murmur Chest: No wheeze/ rales/ dullness; no accessory muscle use, no nasal flaring, no sternal retractions Abd.: Soft Non-tender, ND, BS +, Body mass index is 30.62 kg/m.  Ext: No clubbing cyanosis, edema Neuro:  normal strength, MAE x 4, A&O x 3, appropriate Skin: No rashes, warm and dry, no lesions  Psych: normal mood and behavior   Assessment/Plan Incidental nodule  Plan We will schedule your 1 year  CT chest within the next 2 weeks. We will have you come in for follow up after the scan has been done  the week of 10/17  Chronic Dull L Chest Wall Discomfort Try tylenol one 500 mg tablet for the chest discomfort you are feeling.  Try this for a week, and if it makes no difference you can stop. We can look at the area on CT once it is done.  Please contact office for sooner follow up if symptoms do not improve or worsen or seek emergency care     I spent 35 minutes dedicated to the care of this patient on the date of this encounter to include pre-visit review of records, face-to-face time with the patient discussing conditions above, post visit ordering of testing, clinical documentation with the electronic health record, making appropriate referrals as documented, and communicating necessary information to the patient's healthcare team.   Magdalen Spatz, NP 05/19/2022  11:14 AM

## 2022-05-22 NOTE — Telephone Encounter (Signed)
I scheduled CT and follow up with Judson Roch.  Nothing further needed.

## 2022-05-30 ENCOUNTER — Ambulatory Visit: Payer: Medicare HMO | Admitting: Podiatry

## 2022-05-30 ENCOUNTER — Ambulatory Visit (INDEPENDENT_AMBULATORY_CARE_PROVIDER_SITE_OTHER): Payer: Medicare HMO

## 2022-05-30 ENCOUNTER — Ambulatory Visit (HOSPITAL_COMMUNITY): Payer: Medicare HMO

## 2022-05-30 DIAGNOSIS — M7989 Other specified soft tissue disorders: Secondary | ICD-10-CM

## 2022-05-30 DIAGNOSIS — Z89419 Acquired absence of unspecified great toe: Secondary | ICD-10-CM

## 2022-05-30 DIAGNOSIS — R0989 Other specified symptoms and signs involving the circulatory and respiratory systems: Secondary | ICD-10-CM

## 2022-05-30 DIAGNOSIS — Z89421 Acquired absence of other right toe(s): Secondary | ICD-10-CM

## 2022-05-30 NOTE — Progress Notes (Signed)
Subjective:   Patient ID: [name], male   DOB: 76 y.o.   MRN: [id number]   HPI Chief Complaint  Patient presents with   Foot Orthotics    Foot orthotics right foot 1st 2nd and 3rd amputation     76 y.o. male presents with the above concerns. 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 The 4th toe is starting to turn and he gets some discomfort to the outside of the 5th toe. He had surgery on for the amputaiton due to a lawn mower about 20 years ago. He has swelling to the right foot which started about 1 month ago. No injuries. They thought it was coming from the heart and he had testing for that and he was told that the heart is good.  No injuries.  No other concerns.   Review of Systems  All other systems reviewed and are negative.  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   T2DM (type 2 diabetes mellitus) ([place])     Past Surgical History:  Procedure Laterality Date   BIOPSY  01/26/2022   Procedure: BIOPSY;  Surgeon: Milus Banister, MD;  [facility];  Service: Gastroenterology;;   COLONOSCOPY  04/17/12   Poor prep   COLONOSCOPY  12/20/12   Repeat, 1 Polyp - needs 89yrf/u   ESOPHAGOGASTRODUODENOSCOPY (EGD) WITH PROPOFOL N/A 01/26/2022   Procedure: ESOPHAGOGASTRODUODENOSCOPY (EGD) WITH PROPOFOL;  Surgeon: JMilus Banister MD;  [facility];  Service: Gastroenterology;  Laterality: N/A;   EUS N/A 01/26/2022   Procedure: UPPER ENDOSCOPIC ULTRASOUND (EUS) RADIAL;  Surgeon: JMilus Banister MD;  [facility];  Service: Gastroenterology;  Laterality: N/A;   FOOT SURGERY     NEPHRECTOMY       Current Outpatient Medications:    atorvastatin (LIPITOR) 40 MG tablet, Take 40 mg by mouth at bedtime., Disp: , Rfl:     famotidine (PEPCID) 20 MG tablet, Take 20 mg by mouth 2 (two) times daily., Disp: , Rfl:    furosemide (LASIX) 40 MG tablet, Take 40 mg by mouth every other day., Disp: , Rfl:    hydrALAZINE (APRESOLINE) 100 MG tablet, Take 100 mg by mouth 2 (two) times daily., Disp: , Rfl:    isosorbide mononitrate (IMDUR) 30 MG 24 hr tablet, Take 30 mg by mouth daily., Disp: , Rfl:    Multiple Vitamins-Minerals (ALIVE MENS 50+ PO), Take 1 tablet by mouth daily., Disp: , Rfl:    OVER THE COUNTER MEDICATION, Take 1 capsule by mouth 3 (three) times daily with meals. Kidney Factor (promotes kidney health), Disp: , Rfl:    pantoprazole (PROTONIX) 40 MG tablet, Take 40 mg by mouth 2 (two) times daily., Disp: , Rfl:    primidone (MYSOLINE) 50 MG tablet, Take 50 mg by mouth at bedtime., Disp: , Rfl:    propranolol ER (INDERAL LA) 80 MG 24 hr capsule, Take 80 mg by mouth daily., Disp: , Rfl:   No Known Allergies        Objective:  Physical Exam  General: AAO x3, NAD  Dermatological: There is no open lesions noted bilaterally.  There is a preulcerative lesion at the distal aspect of the right  fourth toe.  No ongoing ulceration drainage or signs of infection  Vascular: Dorsalis Pedis artery and Posterior Tibial artery pedal pulses are decreased on the right however there is moderate edema present in the right foot worse than left.   There is no pain with calf compression, swelling, warmth, erythema.   Neruologic: Sensation somewhat decreased with Semmes Weinstein monofilament  Musculoskeletal: Previous amputations of toes 1 through 3 on the right foot.  The fourth toe on the right foot is starting to turn and as well as the fifth toe.  Is tenderness mostly in the callus at the tip of the fourth toe.  Muscular strength 5/5 in all groups tested bilateral.  Gait: Unassisted, Nonantalgic.       Assessment:   History of toe potation's due to trauma right foot, toe deformity resulted in hyperkeratotic lesion,  swelling     Plan:  -Treatment options discussed including all alternatives, risks, and complications -Etiology of symptoms were discussed -Sharply debrided the hyperkeratotic lesion without any complications or bleeding.  Dispensed offloading pads. -ABI ordered -Venous duplex ordered -Prescription for compression socks given. -Elevation help with swelling.  This is somewhat improved since beginning worse.  No warmth associated there is no open lesions or signs of infection at this time otherwise. -I do think she will benefit from an insert probably for the swelling to improve.    Trula Slade DPM

## 2022-05-31 ENCOUNTER — Ambulatory Visit (HOSPITAL_COMMUNITY)
Admission: RE | Admit: 2022-05-31 | Discharge: 2022-05-31 | Disposition: A | Discharge status: Home / Self Care | Payer: Medicare HMO | Source: Ambulatory Visit | Attending: Acute Care | Admitting: Acute Care

## 2022-05-31 ENCOUNTER — Ambulatory Visit (HOSPITAL_BASED_OUTPATIENT_CLINIC_OR_DEPARTMENT_OTHER)
Admission: RE | Admit: 2022-05-31 | Discharge: 2022-05-31 | Disposition: A | Discharge status: Home / Self Care | Payer: Medicare HMO | Source: Ambulatory Visit | Attending: Podiatry | Admitting: Podiatry

## 2022-05-31 ENCOUNTER — Ambulatory Visit (HOSPITAL_BASED_OUTPATIENT_CLINIC_OR_DEPARTMENT_OTHER)
Admission: RE | Admit: 2022-05-31 | Discharge: 2022-05-31 | Disposition: A | Discharge status: Home / Self Care | Payer: Medicare HMO | Source: Ambulatory Visit | Attending: Acute Care | Admitting: Acute Care

## 2022-05-31 DIAGNOSIS — R911 Solitary pulmonary nodule: Secondary | ICD-10-CM | POA: Diagnosis present

## 2022-05-31 DIAGNOSIS — R0989 Other specified symptoms and signs involving the circulatory and respiratory systems: Secondary | ICD-10-CM | POA: Diagnosis not present

## 2022-05-31 DIAGNOSIS — M7989 Other specified soft tissue disorders: Secondary | ICD-10-CM | POA: Insufficient documentation

## 2022-05-31 NOTE — Progress Notes (Signed)
Right lower extremity venous duplex and ABI's have been completed. Preliminary results can be found in CV Proc through chart review.  Results were given to [facility] at Dr. Leigh Aurora office.  05/31/22 4:02 PM Carlos Levering RVT

## 2022-06-09 ENCOUNTER — Telehealth: Payer: Self-pay | Admitting: Podiatry

## 2022-06-09 ENCOUNTER — Other Ambulatory Visit: Payer: Self-pay | Admitting: Podiatry

## 2022-06-09 DIAGNOSIS — I739 Peripheral vascular disease, unspecified: Secondary | ICD-10-CM

## 2022-06-09 NOTE — Telephone Encounter (Signed)
Pt is calling for test results done on 05/30/22.  Please advise

## 2022-06-12 ENCOUNTER — Other Ambulatory Visit: Payer: Self-pay | Admitting: Podiatry

## 2022-06-13 NOTE — Telephone Encounter (Signed)
This has been addressed in a separate encounter

## 2022-06-16 ENCOUNTER — Ambulatory Visit: Payer: Medicare HMO | Admitting: Acute Care

## 2022-06-16 ENCOUNTER — Other Ambulatory Visit: Payer: Self-pay | Admitting: Podiatry

## 2022-06-16 DIAGNOSIS — M7989 Other specified soft tissue disorders: Secondary | ICD-10-CM

## 2022-06-21 ENCOUNTER — Encounter: Payer: Self-pay | Admitting: Vascular Surgery

## 2022-06-21 ENCOUNTER — Ambulatory Visit: Payer: Medicare HMO | Admitting: Vascular Surgery

## 2022-06-21 VITALS — BP 107/58 | HR 87 | Temp 98.1°F | Resp 20 | Ht 72.0 in | Wt 222.0 lb

## 2022-06-21 DIAGNOSIS — I739 Peripheral vascular disease, unspecified: Secondary | ICD-10-CM | POA: Diagnosis not present

## 2022-06-21 DIAGNOSIS — M7989 Other specified soft tissue disorders: Secondary | ICD-10-CM

## 2022-06-21 NOTE — Progress Notes (Signed)
Patient ID: [name], male   DOB: Jan 06, 1946, 76 y.o.   MRN: [id number]  Reason for Consult: New Patient (Initial Visit)   Referred by Sonia Side., FNP  Subjective:     HPI:  [name] is a 76 y.o. 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  He is all healed without incidence.  He also had a fracture of his right ankle as a child where he has some bony deformities and also has a well-healed scar on the medial part of the lower leg.  Recently had swelling in the right leg.  Denies any history of DVT.  Has been using an Ace wrap and the swelling is mostly resolved states that now he can wear compression sock.  Does not have any changes in the skin or lower extremity ulceration at this time.  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   T2DM (type 2 diabetes mellitus) ([place])    Family History  Problem Relation Age of Onset   Asthma Mother    Cancer Maternal Grandmother    Cancer Maternal Grandfather    Colon cancer Neg Hx    Stomach cancer Neg Hx    Esophageal cancer Neg Hx    Past Surgical History:  Procedure Laterality Date   BIOPSY  01/26/2022   Procedure: BIOPSY;  Surgeon: Milus Banister, MD;  Location: [facility];  Service: Gastroenterology;;   COLONOSCOPY  04/17/12   Poor prep   COLONOSCOPY  12/20/12   Repeat, 1 Polyp - needs 71yrf/u   ESOPHAGOGASTRODUODENOSCOPY (EGD) WITH PROPOFOL N/A 01/26/2022   Procedure: ESOPHAGOGASTRODUODENOSCOPY (EGD) WITH PROPOFOL;  Surgeon: JMilus Banister MD;  [facility];  Service: Gastroenterology;  Laterality: N/A;   EUS N/A 01/26/2022   Procedure: UPPER ENDOSCOPIC ULTRASOUND (EUS) RADIAL;  Surgeon: JMilus Banister MD;  Location: [facility];   Service: Gastroenterology;  Laterality: N/A;   FOOT SURGERY     NEPHRECTOMY      Short Social History:  Social History   Tobacco Use   Smoking status: Never   Smokeless tobacco: Never  Substance Use Topics   Alcohol use: Never    No Known Allergies  Current Outpatient Medications  Medication Sig Dispense Refill   atorvastatin (LIPITOR) 40 MG tablet Take 40 mg by mouth at bedtime.     famotidine (PEPCID) 20 MG tablet Take 20 mg by mouth 2 (two) times daily.     furosemide (LASIX) 40 MG tablet Take 40 mg by mouth every other day.     hydrALAZINE (APRESOLINE) 100 MG tablet Take 100 mg by mouth 2 (two) times daily.     isosorbide mononitrate (IMDUR) 30 MG 24 hr tablet Take 30 mg by mouth daily.     Multiple Vitamins-Minerals (ALIVE MENS 50+ PO) Take 1 tablet by mouth daily.     OVER THE COUNTER MEDICATION Take 1 capsule by mouth 3 (three) times daily with meals. Kidney Factor (promotes kidney health)     pantoprazole (PROTONIX) 40 MG tablet Take 40 mg by mouth 2 (two) times daily.     primidone (MYSOLINE) 50 MG tablet Take 50 mg  by mouth at bedtime.     propranolol ER (INDERAL LA) 80 MG 24 hr capsule Take 80 mg by mouth daily.     No current facility-administered medications for this visit.    Review of Systems  Constitutional:  Constitutional negative. HENT: HENT negative.  Eyes: Eyes negative.  Respiratory: Respiratory negative.  Cardiovascular: Positive for leg swelling.  Musculoskeletal: Musculoskeletal negative.  Neurological: Neurological negative. Hematologic: Hematologic/lymphatic negative.  Psychiatric: Psychiatric negative.        Objective:  Objective   Vitals:   06/21/22 1208  BP: (!) 107/58  Pulse: 87  Resp: 20  Temp: 98.1 F (36.7 C)  SpO2: 96%     Physical Exam HENT:     Head: Normocephalic.     Nose: Nose normal.  Eyes:     Pupils: Pupils are equal, round, and reactive to light.  Cardiovascular:     Pulses:          Popliteal pulses are  1+ on the right side and 2+ on the left side.       Dorsalis pedis pulses are 0 on the right side.       Posterior tibial pulses are 0 on the right side.  Pulmonary:     Effort: Pulmonary effort is normal.  Abdominal:     General: Abdomen is flat.     Palpations: Abdomen is soft.  Musculoskeletal:        General: Normal range of motion.     Right lower leg: Edema present.     Left lower leg: No edema.     Comments: Well-healed first 3 toe amputation on the right  Skin:    General: Skin is warm.  Neurological:     General: No focal deficit present.     Mental Status: He is alert.  Psychiatric:        Mood and Affect: Mood normal.     Data: ABI Findings:  +--------+------------------+-----+----------+--------+  Right   Rt Pressure (mmHg)IndexWaveform  Comment   +--------+------------------+-----+----------+--------+  [id number]                    triphasic           +--------+------------------+-----+----------+--------+  PTA     88                0.64 monophasic          +--------+------------------+-----+----------+--------+  DP      127               0.92 monophasic          +--------+------------------+-----+----------+--------+   +---------+------------------+-----+---------+-------+  Left     Lt Pressure (mmHg)IndexWaveform Comment  +---------+------------------+-----+---------+-------+  Brachial 133                    triphasic         +---------+------------------+-----+---------+-------+  PTA      169               1.22 triphasic         +---------+------------------+-----+---------+-------+  DP       160               1.16 triphasic         +---------+------------------+-----+---------+-------+  Great Toe107               0.78                   +---------+------------------+-----+---------+-------+   +-------+-----------+-----------+------------+------------+  ABI/TBIToday's ABIToday's TBIPrevious  ABIPrevious TBI  +-------+-----------+-----------+------------+------------+  Right  0.92                                            +-------+-----------+-----------+------------+------------+  Left   1.22       0.78                                 +-------+-----------+-----------+------------+------------+           Summary:  Right: Resting right ankle-brachial index indicates mild right lower  extremity arterial disease.   Unable to obtain TBI due to great toe amputation.  Left: Resting left ankle-brachial index is within normal range. The left  toe-brachial index is normal.   RIGHT    CompressibilityPhasicitySpontaneityPropertiesThrombus  Aging  +---------+---------------+---------+-----------+----------+--------------+   CFV      Full           Yes      Yes                                    +---------+---------------+---------+-----------+----------+--------------+   SFJ      Full                                                           +---------+---------------+---------+-----------+----------+--------------+   FV Prox  Full                                                           +---------+---------------+---------+-----------+----------+--------------+   FV Mid   Full                                                           +---------+---------------+---------+-----------+----------+--------------+   FV DistalFull                                                           +---------+---------------+---------+-----------+----------+--------------+   PFV      Full                                                           +---------+---------------+---------+-----------+----------+--------------+   POP      Full           Yes      Yes                                    +---------+---------------+---------+-----------+----------+--------------+  PTV      Full                                                            +---------+---------------+---------+-----------+----------+--------------+   PERO     Full                                                           +---------+---------------+---------+-----------+----------+--------------+            +----+---------------+---------+-----------+----------+--------------+  LEFTCompressibilityPhasicitySpontaneityPropertiesThrombus Aging  +----+---------------+---------+-----------+----------+--------------+  CFV Full           Yes      Yes                                  +----+---------------+---------+-----------+----------+--------------+              Summary:  RIGHT:  - There is no evidence of deep vein thrombosis in the lower extremity.     - No cystic structure found in the popliteal fossa.     LEFT:  - No evidence of common femoral vein obstruction.      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  Recently had significant swelling of right lower extremity which is mostly resolved now he is wearing an Ace wrap.  At this time I recommended compression sock as tolerated.  He does have decreased ABIs this may be secondary to previous trauma but he appears asymptomatic from this as well.  He can follow-up with me on an as-needed basis.     Waynetta Sandy MD [facility]

## 2022-06-30 ENCOUNTER — Encounter: Payer: Self-pay | Admitting: Gastroenterology

## 2022-07-04 ENCOUNTER — Encounter: Payer: Self-pay | Admitting: Acute Care

## 2022-07-04 ENCOUNTER — Ambulatory Visit: Payer: Medicare HMO | Admitting: Acute Care

## 2022-07-04 VITALS — BP 130/70 | HR 78 | Ht 72.0 in | Wt 228.8 lb

## 2022-07-04 DIAGNOSIS — R911 Solitary pulmonary nodule: Secondary | ICD-10-CM | POA: Diagnosis not present

## 2022-07-04 NOTE — Patient Instructions (Addendum)
It is good to see you today. Your follow up CT Chest shows stable nodules.  This is good news. There was notation of debris in your upper trachea, which may be related to your difficulties with swallowing your food. Follow up with GI as scheduled for endoscopy and Colonoscopy. Continue Tylenol as needed for chest wall pain. I am glad you are having relief with that.  Follow up Chest CT in 6 months  We will get you in to review the scan after it is done Call if you need [place] sooner.  Let us know if you change your mind about the flu vaccine.  Please contact office for sooner follow up if symptoms do not improve or worsen or seek emergency care

## 2022-07-04 NOTE — Progress Notes (Signed)
History of Present Illness [name] is a 76 y.o. male never smoker  with past medical history of Prostate cancer diagnosed in 2014, renal cancer status post right nephrectomy in 2007.  Patient had an incidentally found a small 3 mm lung nodule.  He is a lifelong non-smoker. No family history of lung cancer.  Patient was referred to Dr. Valeta Harms for evaluation of a small 3 mm subpleural lesion found on the right lung.    07/04/2022 Pt. Presents for follow up after CT Chest as folow up to a pulmonary nodule noted on a calcium scoring CT Chest 03/2021. Plan was for a 1 year follow up CT Chest. Patient missed the appointment for this scan and it was rescheduled for 05/2022. He had this scan done 06/01/2022, and he is here for follow up to review the results.  Pt. 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 He suspects he may have been exposed to asbestos in that line of work. He was also a Furniture conservator/restorer x 30 years, and this could also have pre-disposed him to exposure to asbestos.  CT Chest without contrast shows no suspicious pulmonary nodules. There was notation of adherent debris along the ventral wall the upper trachea. A nodular lesion cannot be definitively excluded. Radiology recommend follow-up CT chest without contrast in 3-4 weeks in further evaluation. I will check with Dr. Valeta Harms to see if he feels that is necessary. Pt. States he does not get choked on his food. I was wondering if this could be aspiration related. He does have sensation/ history  of food getting stuck in his esophogus which gets better when he drinks water. This may be what was seen on his CT. He is scheduled for an endoscopy and a colonoscopy  in the next week or two.He states this is being done through [facility]. This may provide clarity.   Test Results: CT Chest without Contrast 06/01/2022 Lungs/Pleura: Calcified and noncalcified pleural plaques bilaterally, some of which appear slightly nodular,  simulating pulmonary nodules. Nodules along the minor fissure measure 2 mm or less in size, unchanged and benign. No follow-up necessary. No pleural fluid. Likely adherent debris along the ventral wall of the upper trachea (7/32) with additional dependent debris in the mid trachea. No suspicious pulmonary nodules. Asbestos related pleural disease. Likely adherent debris along the ventral wall the upper trachea. A nodular lesion cannot be definitively excluded. Consider follow-up CT chest without contrast in 3-4 weeks in further evaluation, as clinically indicated. 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 Recommend annual imaging followup by CTA or MRA.      Latest Ref Rng & Units 04/23/2022   12:18 AM 10/12/2021    9:55 AM 09/08/2019   11:38 AM  CBC  WBC 4.0 - 10.5 K/uL 6.0  5.4  4.7   Hemoglobin 13.0 - 17.0 g/dL 9.3  10.2  11.8   Hematocrit 39.0 - 52.0 % 27.5  30.9  35.6   Platelets 150 - 400 K/uL 177  190.0  174.0        Latest Ref Rng & Units 04/23/2022   12:18 AM 09/08/2019   11:38 AM 12/07/2011    3:11 PM  BMP  Glucose 70 - 99 mg/dL 95  152  76   BUN 8 - 23 mg/dL 66  50  23   Creatinine 0.61 - 1.24 mg/dL 4.17  3.64  1.90   Sodium 135 - 145 mmol/L 135  137  135  Potassium 3.5 - 5.1 mmol/L 4.9  4.4  4.7   Chloride 98 - 111 mmol/L 111  108  106   CO2 22 - 32 mmol/L '15  20  18   '$ Calcium 8.9 - 10.3 mg/dL 8.8  9.4  9.7     BNP No results found for: "BNP"  ProBNP No results found for: "PROBNP"  PFT No results found for: "FEV1PRE", "FEV1POST", "FVCPRE", "FVCPOST", "TLC", "DLCOUNC", "PREFEV1FVCRT", "PSTFEV1FVCRT"  DG Foot Complete Right  Result Date: 06/16/2022 Please see detailed radiograph report in office note.  DG Foot Complete Left  Result Date: 06/16/2022 Please see detailed radiograph report in office note.    Past medical hx Past Medical History:  Diagnosis Date   Allergy    Cancer ([place])    prostate /renal ca   CKD (chronic kidney disease)    GERD  (gastroesophageal reflux disease)    Hyperlipidemia    Hypertension    Renal insufficiency    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   T2DM (type 2 diabetes mellitus) ([place])      Social History   Tobacco Use   Smoking status: Never   Smokeless tobacco: Never  Vaping Use   Vaping Use: Never used  Substance Use Topics   Alcohol use: Never   Drug use: Never    [name] reports that he has never smoked. He has never used smokeless tobacco. He reports that he does not drink alcohol and does not use drugs.  Tobacco Cessation: Never smoker   Past surgical hx, Family hx, Social hx all reviewed.  Current Outpatient Medications on File Prior to Visit  Medication Sig   atorvastatin (LIPITOR) 40 MG tablet Take 40 mg by mouth at bedtime.   famotidine (PEPCID) 20 MG tablet Take 20 mg by mouth 2 (two) times daily.   furosemide (LASIX) 40 MG tablet Take 40 mg by mouth every other day.   hydrALAZINE (APRESOLINE) 100 MG tablet Take 100 mg by mouth 2 (two) times daily.   isosorbide mononitrate (IMDUR) 30 MG 24 hr tablet Take 30 mg by mouth daily.   Multiple Vitamins-Minerals (ALIVE MENS 50+ PO) Take 1 tablet by mouth daily.   OVER THE COUNTER MEDICATION Take 1 capsule by mouth 3 (three) times daily with meals. Kidney Factor (promotes kidney health)   pantoprazole (PROTONIX) 40 MG tablet Take 40 mg by mouth 2 (two) times daily.   primidone (MYSOLINE) 50 MG tablet Take 50 mg by mouth at bedtime.   propranolol ER (INDERAL LA) 80 MG 24 hr capsule Take 80 mg by mouth daily.   No current facility-administered medications on file prior to visit.     No Known Allergies  Review Of Systems:  Constitutional:   No  weight loss, night sweats,  Fevers, chills, fatigue, or  lassitude.  HEENT:   No headaches,  Difficulty swallowing,  Tooth/dental problems, or  Sore throat,                No sneezing, itching, ear ache, nasal congestion, post nasal drip,   CV:  No chest pain,  Orthopnea, PND,  swelling in lower extremities, anasarca, dizziness, palpitations, syncope.   GI  No heartburn, indigestion, abdominal pain, nausea, vomiting, diarrhea, change in bowel habits, loss of appetite, bloody stools.   Resp: No shortness of breath with exertion or at rest.  No excess mucus, no productive cough,  No non-productive cough,  No coughing up of blood.  No change in color of  mucus.  No wheezing.  No chest wall deformity  Skin: no rash or lesions.  GU: no dysuria, change in color of urine, no urgency or frequency.  No flank pain, no hematuria   MS:  No joint pain or swelling.  No decreased range of motion.  No back pain. + L chest pain that resolves with tylenol.  Psych:  No change in mood or affect. No depression or anxiety.  No memory loss.   Vital Signs BP 130/70 (BP Location: Left Arm)   Pulse 78   Ht 6' (1.829 m)   Wt 228 lb 12.8 oz (103.8 kg)   SpO2 97%   BMI 31.03 kg/m    Physical Exam:  General- No distress,  A&Ox3 ENT: No sinus tenderness, TM clear, pale nasal mucosa, no oral exudate,no post nasal drip, no LAN Cardiac: S1, S2, regular rate and rhythm, no murmur Chest: No wheeze/ rales/ dullness; no accessory muscle use, no nasal flaring, no sternal retractions Abd.: Soft Non-tender, ND, BS +, Body mass index is 31.03 kg/m.  Ext: No clubbing cyanosis, edema Neuro:  normal strength, MAE x 4, A&O x 3 Skin: No rashes, warm and dry, no lesions  Psych: normal mood and behavior   Assessment/Plan Incidental Pulmonary Nodule in non-smoker Stable on annual follow up 06/01/2022 Plan Your follow up CT Chest shows stable nodules.  This is good news. There was notation of debris in your upper trachea, which may be related to your difficulties with swallowing your food. Follow up with GI as scheduled for endoscopy and Colonoscopy. Continue Tylenol as needed for chest wall pain. I am glad you are having relief with that.  Follow up Chest CT in 6 months  We will get you in  to review the scan after it is done Call if you need [place] sooner.  Let us know if you change your mind about the flu vaccine.  Please contact office for sooner follow up if symptoms do not improve or worsen or seek emergency care    I spent 35 minutes dedicated to the care of this patient on the date of this encounter to include pre-visit review of records, face-to-face time with the patient discussing conditions above, post visit ordering of testing, clinical documentation with the electronic health record, making appropriate referrals as documented, and communicating necessary information to the patient's healthcare team.    Magdalen Spatz, NP 07/04/2022  4:47 PM

## 2022-07-17 ENCOUNTER — Ambulatory Visit (AMBULATORY_SURGERY_CENTER): Payer: Self-pay | Admitting: *Deleted

## 2022-07-17 VITALS — Ht 72.0 in | Wt 227.0 lb

## 2022-07-17 DIAGNOSIS — Z86010 Personal history of colonic polyps: Secondary | ICD-10-CM

## 2022-07-17 DIAGNOSIS — Z8719 Personal history of other diseases of the digestive system: Secondary | ICD-10-CM

## 2022-07-17 MED ORDER — PEG 3350-KCL-NA BICARB-NACL 420 G PO SOLR: 4000.0000 mL | Freq: Once | ORAL | 0 refills | Status: AC

## 2022-07-17 NOTE — Progress Notes (Signed)

## 2022-07-18 NOTE — Progress Notes (Signed)
[name],   Patient has a small nodular lesion which is likely mucous located in the anterior tracheal wall.  He needs a repeat CT chest to ensure resolution of this lesion as it could be something else and we need to make sure that it goes away.  Please order a repeat CT chest for the patient to be completed and a virtual follow-up appointment with SG, NP after CT complete.  Thanks,  BLI  Garner Nash, DO [facility] 07/18/2022 3:21 PM

## 2022-07-21 ENCOUNTER — Other Ambulatory Visit: Payer: Self-pay

## 2022-07-23 IMAGING — MR MR ABDOMEN WO/W CM
18 series · 48 of 48 positions shown · IV contrast (gadavist)
Comparison: CT abdomen, 09/16/2019, renal ultrasound, 04/19/2020

CLINICAL DATA: Right renal cell carcinoma, status post nephrectomy,
characterize left renal cysts

EXAM:
MRI ABDOMEN WITHOUT AND WITH CONTRAST
TECHNIQUE: Multiplanar multisequence MR imaging of the abdomen was performed
both before and after the administration of intravenous contrast.
CONTRAST:  10mL GADAVIST GADOBUTROL 1 MMOL/ML IV SOLN

[Series 2: T2 · coronal · 6.0mm · 1.72mm/px · 2 of 40 slices shown (1 of 2)]
[im 1/40]
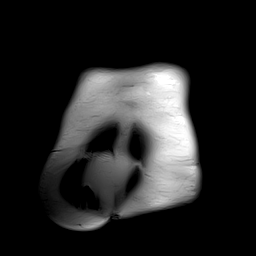
[im 40/40]
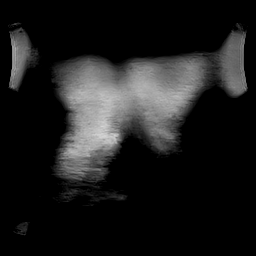

[Series 4: T2 fat-sat · axial · 6.0mm · 1.29mm/px · z∈[-137,+115]mm · 2 of 36 slices shown]
[im 1/36]
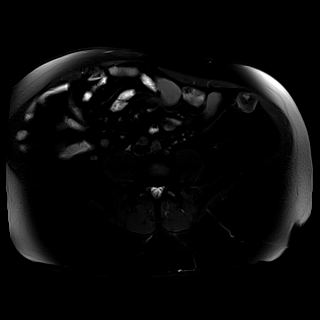
[im 36/36]
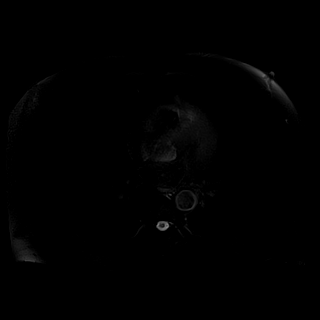

[Series 5: T1 · axial · 3.0mm · 1.38mm/px · z∈[-118,+118]mm · 4 of 80 slices shown (1 of 2)]
[im 1/80]
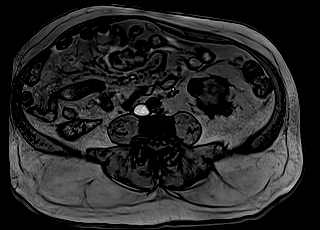
[im 27/80]
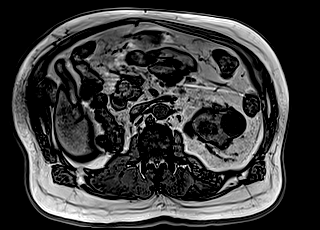
[im 53/80]
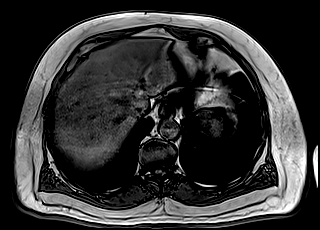
[im 80/80]
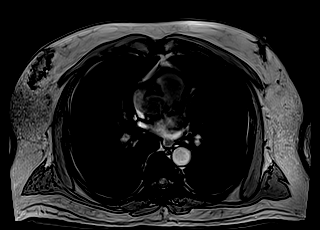

[Series 6: T1 · axial · 3.0mm · 1.38mm/px · z∈[-118,+118]mm · 3 of 80 slices shown (2 of 2)]
[im 1/80]
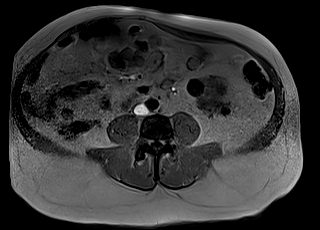
[im 40/80]
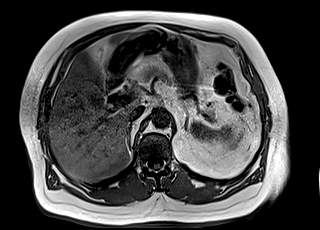
[im 80/80]
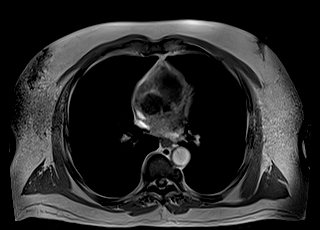

[Series 7: DWI · axial · 6.0mm · 1.64mm/px · z∈[-161,+134]mm · 3 of 84 slices shown (1 of 2)]
[im 1/84]
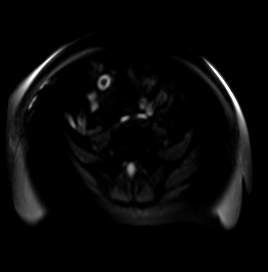
[im 42/84]
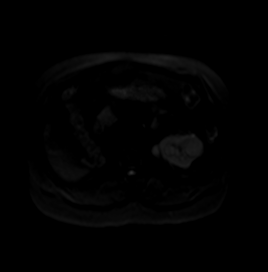
[im 84/84]
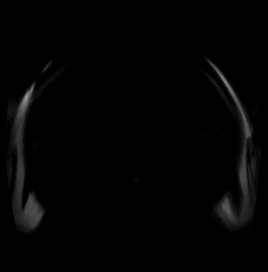

[Series 8: DWI · axial · 6.0mm · 1.64mm/px · z∈[-161,+134]mm · 2 of 42 slices shown (2 of 2)]
[im 1/42]
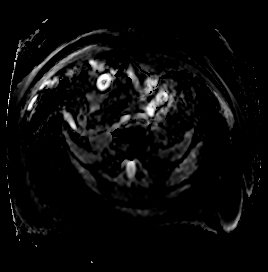
[im 42/42]
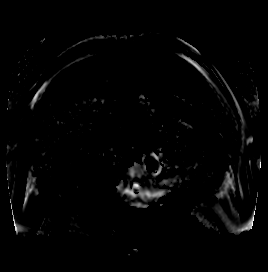

[Series 9: bSSFP · axial · 5.0mm · 0.88mm/px · z∈[-162,+107]mm · 2 of 50 slices shown]
[im 1/50]
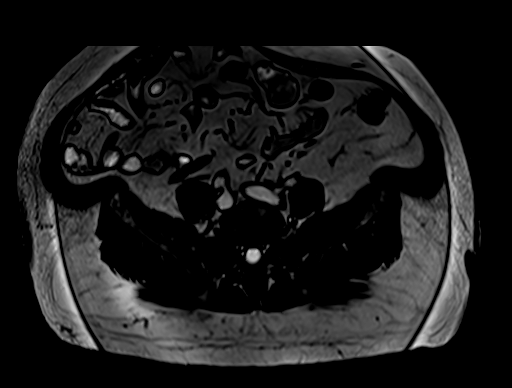
[im 50/50]
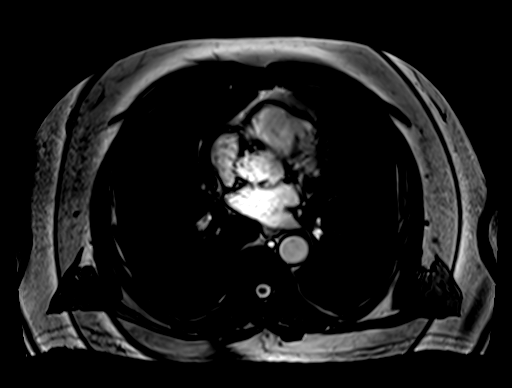

[Series 11: T1 dynamic · axial · 3.4mm · 1.41mm/px · z∈[-169,+126]mm · 3 of 88 slices shown (1 of 6)]
[im 1/88]
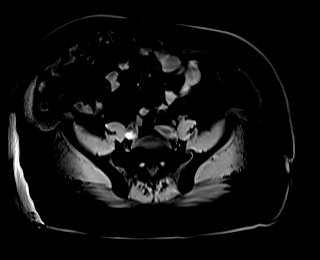
[im 44/88]
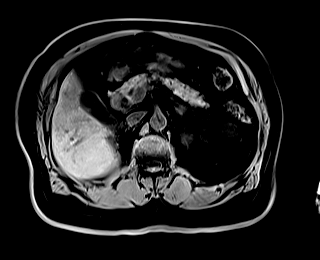
[im 88/88]
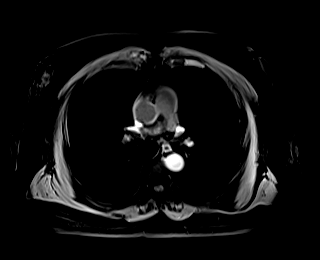

[Series 14: T1 dynamic · axial · 3.4mm · 1.41mm/px · z∈[-169,+126]mm · 3 of 88 slices shown (2 of 6)]
[im 1/88]
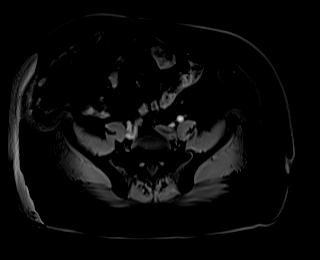
[im 44/88]
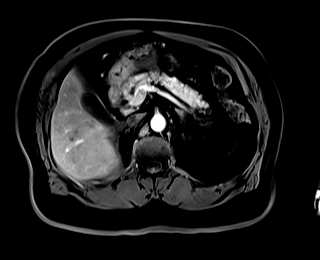
[im 88/88]
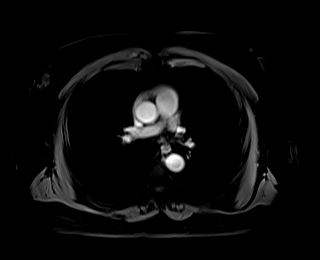

[Series 16: T1 dynamic · axial · 3.4mm · 1.41mm/px · z∈[-169,+126]mm · 3 of 88 slices shown (3 of 6)]
[im 1/88]
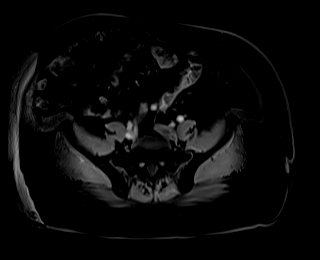
[im 44/88]
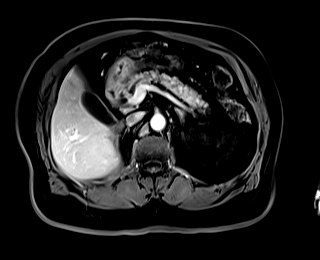
[im 88/88]
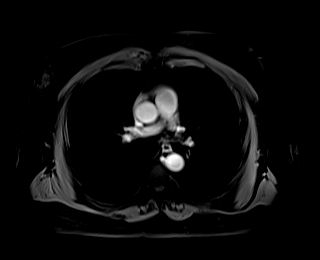

[Series 18: T1 dynamic · axial · 3.4mm · 1.41mm/px · z∈[-169,+126]mm · 3 of 88 slices shown (4 of 6)]
[im 1/88]
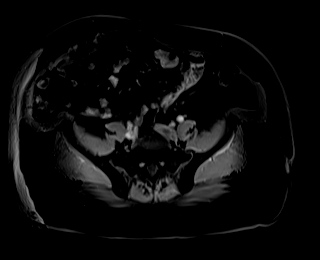
[im 44/88]
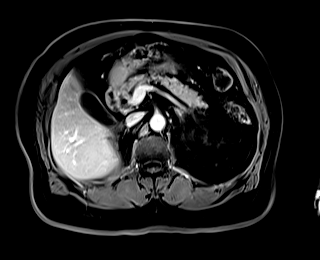
[im 88/88]
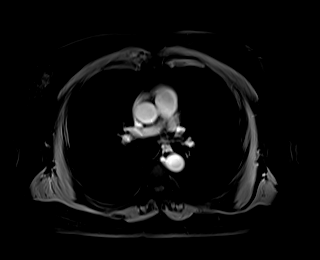

[Series 20: T1 dynamic · coronal · 5.0mm · 1.41mm/px · 2 of 56 slices shown (5 of 6)]
[im 1/56]
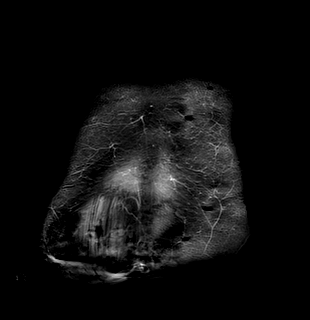
[im 56/56]
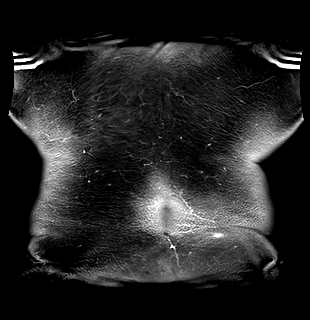

[Series 21: T2 · axial · 6.0mm · 1.56mm/px · 1 of 40 slices shown (2 of 2)]
[im 1/40]
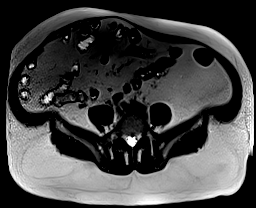

[Series 23: T1 dynamic · axial · 3.4mm · 1.41mm/px · z∈[-169,+126]mm · 3 of 88 slices shown (6 of 6)]
[im 1/88]
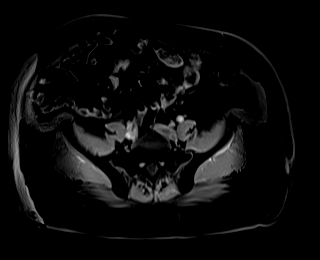
[im 44/88]
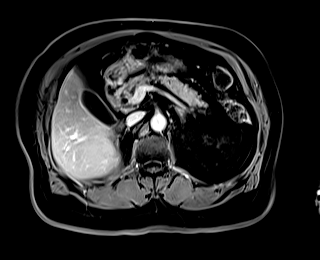
[im 88/88]
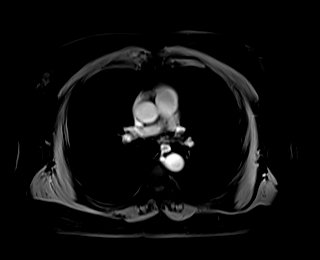

[Series 100: sub_20 sec · axial · 3.4mm · 1.41mm/px · z∈[-169,+126]mm · 3 of 88 slices shown]
[im 1/88]
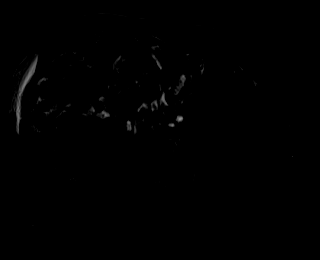
[im 44/88]
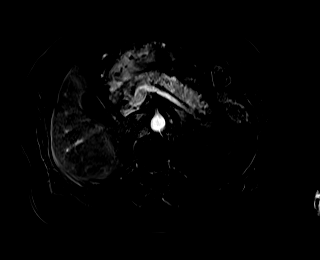
[im 88/88]
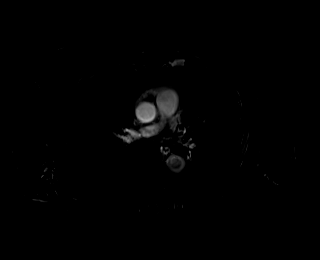

[Series 101: sub_45 sec · axial · 3.4mm · 1.41mm/px · z∈[-169,+126]mm · 3 of 88 slices shown]
[im 1/88]
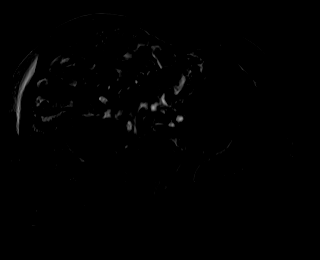
[im 44/88]
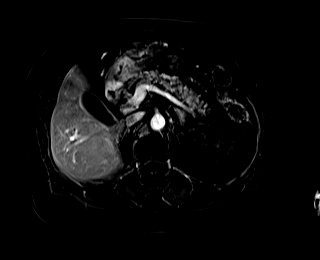
[im 88/88]
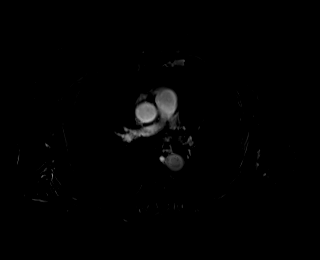

[Series 102: sub_90 sec · axial · 3.4mm · 1.41mm/px · z∈[-169,+126]mm · 3 of 88 slices shown]
[im 1/88]
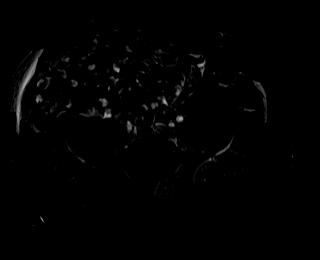
[im 44/88]
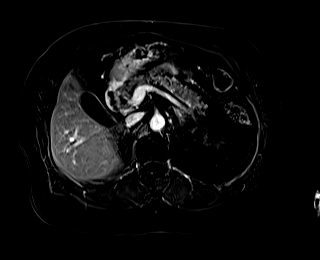
[im 88/88]
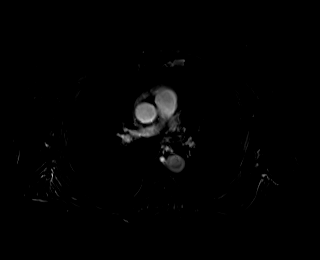

[Series 103: sub_delay · axial · 3.4mm · 1.41mm/px · z∈[-169,+126]mm · 3 of 88 slices shown]
[im 1/88]
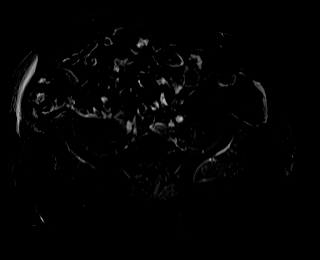
[im 44/88]
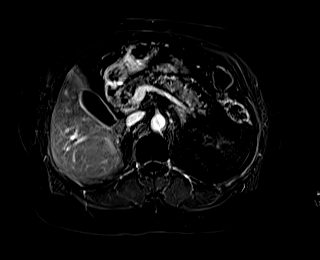
[im 88/88]
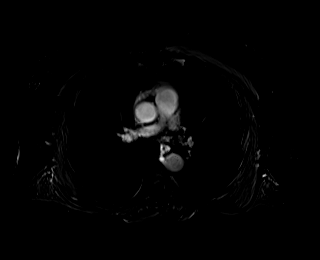

[48 of 48 positions shown; findings below may reference images not displayed]

FINDINGS: Lower chest: No acute findings.

Hepatobiliary: No mass or other parenchymal abnormality identified.

Pancreas: No mass, inflammatory changes, or other parenchymal
abnormality identified.

Spleen: Within normal limits in size. Incidental note of a benign
lobulated cyst measuring 2.0 cm (series 14, image 34).

Adrenals/Urinary Tract: Status post right nephrectomy. No evidence
of soft tissue or abnormal contrast enhancement in the right renal
fossa. There are numerous left renal cysts of varying sizes, most
notably a cluster of thinly septated cysts in the midportion of the
left kidney measuring approximately 5.4 cm in greatest extent
(series 4, image 24). These are not grossly changed in appearance
compared to prior examinations and demonstrate no evident solid
component or suspicious contrast enhancement. The remaining cysts
are simple in appearance and there are numerous subcentimeter fluid
signal lesions, too small to characterize although most likely
subcentimeter cysts. No hydronephrosis. The adrenals are normal.

Stomach/Bowel: Visualized portions within the abdomen are
unremarkable.

Vascular/Lymphatic: No pathologically enlarged lymph nodes
identified. No abdominal aortic aneurysm demonstrated.

Other:  None.

Musculoskeletal: No suspicious bone lesions identified.
IMPRESSION: 1. Status post right nephrectomy. No evidence of local recurrence,
lymphadenopathy, or metastatic disease in the abdomen.
2. Numerous left renal cysts of varying sizes, most notably a
cluster of thinly septated cysts in the midportion of the left
kidney measuring approximately 5.4 cm in greatest extent. These are
not grossly changed in appearance compared to prior examinations and
demonstrate no evident solid component or suspicious contrast
enhancement. There are additional simple cysts and fluid signal
lesions too small to characterize although statistically likely to
be subcentimeter simple cysts. No specific follow-up is indicated
for these cysts, although ongoing surveillance for renal cell
carcinoma can be considered given patient history of contralateral
renal cell carcinoma.

## 2022-07-31 ENCOUNTER — Encounter: Payer: Self-pay | Admitting: Gastroenterology

## 2022-08-01 ENCOUNTER — Ambulatory Visit: Payer: Medicare HMO | Admitting: Podiatry

## 2022-08-01 DIAGNOSIS — Z89419 Acquired absence of unspecified great toe: Secondary | ICD-10-CM | POA: Diagnosis not present

## 2022-08-01 DIAGNOSIS — Z89421 Acquired absence of other right toe(s): Secondary | ICD-10-CM | POA: Diagnosis not present

## 2022-08-01 DIAGNOSIS — L84 Corns and callosities: Secondary | ICD-10-CM

## 2022-08-03 ENCOUNTER — Telehealth: Payer: Self-pay

## 2022-08-03 ENCOUNTER — Other Ambulatory Visit: Payer: Self-pay

## 2022-08-03 ENCOUNTER — Ambulatory Visit (AMBULATORY_SURGERY_CENTER): Payer: Medicare HMO | Admitting: Gastroenterology

## 2022-08-03 ENCOUNTER — Encounter: Payer: Self-pay | Admitting: Gastroenterology

## 2022-08-03 VITALS — BP 174/93 | HR 76 | Temp 97.7°F | Resp 11 | Ht 72.0 in | Wt 227.0 lb

## 2022-08-03 DIAGNOSIS — Z09 Encounter for follow-up examination after completed treatment for conditions other than malignant neoplasm: Secondary | ICD-10-CM | POA: Diagnosis not present

## 2022-08-03 DIAGNOSIS — D123 Benign neoplasm of transverse colon: Secondary | ICD-10-CM

## 2022-08-03 DIAGNOSIS — Z86010 Personal history of colonic polyps: Secondary | ICD-10-CM

## 2022-08-03 DIAGNOSIS — K317 Polyp of stomach and duodenum: Secondary | ICD-10-CM

## 2022-08-03 DIAGNOSIS — D122 Benign neoplasm of ascending colon: Secondary | ICD-10-CM

## 2022-08-03 DIAGNOSIS — K869 Disease of pancreas, unspecified: Secondary | ICD-10-CM

## 2022-08-03 DIAGNOSIS — Z8719 Personal history of other diseases of the digestive system: Secondary | ICD-10-CM | POA: Diagnosis not present

## 2022-08-03 MED ORDER — SODIUM CHLORIDE 0.9 % IV SOLN: 500.0000 mL | Freq: Once | INTRAVENOUS | Status: AC

## 2022-08-03 NOTE — Op Note (Signed)
[facility] Patient Name: [name] Procedure Date: 08/03/2022 2:21 PM MRN: [id number] Endoscopist: Thornton Park MD, MD, [id number] Age: 76 Referring MD:  Date of Birth: 1946/04/10 Gender: Male Account #: [id number] Procedure:                Upper GI endoscopy Indications:              Follow-up of polyps in the duodenum                           EGD/EUS 01/2022 to evaluate chronically elevated                            lipase showed a periampullary nodule. Biopsies                            showed inflammatory changes.                           EUS showed changes of chronic pancreatitis. The 1                            cm periampullary nodule did not show signs of                            deeper mass, no signs of involvement with the                            muscularis propria. Medicines:                Monitored Anesthesia Care Procedure:                Pre-Anesthesia Assessment:                           - Prior to the procedure, a History and Physical                            was performed, and patient medications and                            allergies were reviewed. The patient's tolerance of                            previous anesthesia was also reviewed. The risks                            and benefits of the procedure and the sedation                            options and risks were discussed with the patient.                            All questions were answered, and informed consent  was obtained. Prior Anticoagulants: The patient has                            taken no anticoagulant or antiplatelet agents. ASA                            Grade Assessment: III - A patient with severe                            systemic disease. After reviewing the risks and                            benefits, the patient was deemed in satisfactory                            condition to undergo the procedure.                            After obtaining informed consent, the endoscope was                            passed under direct vision. Throughout the                            procedure, the patient's blood pressure, pulse, and                            oxygen saturations were monitored continuously. The                            Endoscope was introduced through the mouth, and                            advanced to the third part of duodenum. The upper                            GI endoscopy was accomplished without difficulty.                            The patient tolerated the procedure well. Scope In: Scope Out: Findings:                 The examined esophagus was normal.                           A single large sessile polyp with evidence for                            recent bleeding was found in the gastric body. Old                            heme was present throughout the stomach. Biopsies  were taken of the polyp with a cold forceps for                            histology. Only one biopsy was obtained due to                            extensive oozing that occured. Estimated blood loss                            was minimal.                           A single medium-sized carpet-like polyp with no                            bleeding was found in the ampulla. Biopsies were                            taken with a cold forceps for histology. Estimated                            blood loss was minimal.                           The exam was otherwise without abnormality. Complications:            No immediate complications. Estimated Blood Loss:     Estimated blood loss was minimal. Impression:               - Normal esophagus.                           - A single gastric polyp. With evidence for recent                            bleeding. Biopsied.                           - A single periampullary polyp. This appears larger                            than on photos from EUS  from 01/2022. Biopsied.                           - The examination was otherwise normal. Recommendation:           - Patient has a contact number available for                            emergencies. The signs and symptoms of potential                            delayed complications were discussed with the                            patient. Return  to normal activities tomorrow.                            Written discharge instructions were provided to the                            patient.                           - Resume previous diet.                           - Continue present medications.                           - Await pathology results.                           - Repeat upper endoscopy with EMR of the gastric                            polyp and possible repeat EUS of the periampullary                            polyp. (Will review with Dr. Rush Landmark)                           - CT pancreatic protocol in the meantime. Thornton Park MD, MD 08/03/2022 3:11:21 PM This report has been signed electronically.

## 2022-08-03 NOTE — Progress Notes (Signed)
A and O x3. Report to RN. Tolerated MAC anesthesia well.Teeth unchanged after procedure. 

## 2022-08-03 NOTE — Patient Instructions (Addendum)
Information on polyps given to you today.  Patient has a contact number available for emergencies. The signs and symptoms of potential delayed complications were discussed with the patient. Return to normal activities tomorrow. Written discharge instructions were provided to the patient. - Resume previous diet. - Continue present medications. - Await pathology results. - Repeat upper endoscopy with EMR of the gastric polyp and possible repeat EUS of the periampullary polyp. (Will review with Dr. Rush Landmark) - CT pancreatic protocol in the meantime.   YOU HAD AN ENDOSCOPIC PROCEDURE TODAY AT [facility]:   Refer to the procedure report that was given to you for any specific questions about what was found during the examination.  If the procedure report does not answer your questions, please call your gastroenterologist to clarify.  If you requested that your care partner not be given the details of your procedure findings, then the procedure report has been included in a sealed envelope for you to review at your convenience later.  YOU SHOULD EXPECT: Some feelings of bloating in the abdomen. Passage of more gas than usual.  Walking can help get rid of the air that was put into your GI tract during the procedure and reduce the bloating. If you had a lower endoscopy (such as a colonoscopy or flexible sigmoidoscopy) you may notice spotting of blood in your stool or on the toilet paper. If you underwent a bowel prep for your procedure, you may not have a normal bowel movement for a few days.  Please Note:  You might notice some irritation and congestion in your nose or some drainage.  This is from the oxygen used during your procedure.  There is no need for concern and it should clear up in a day or so.  SYMPTOMS TO REPORT IMMEDIATELY:  Following lower endoscopy (colonoscopy or flexible sigmoidoscopy):  Excessive amounts of blood in the stool  Significant tenderness or worsening of  abdominal pains  Swelling of the abdomen that is new, acute  Fever of 100F or higher  Following upper endoscopy (EGD)  Vomiting of blood or coffee ground material  New chest pain or pain under the shoulder blades  Painful or persistently difficult swallowing  New shortness of breath  Fever of 100F or higher  Black, tarry-looking stools  For urgent or emergent issues, a gastroenterologist can be reached at any hour by calling [phone]. Do not use MyChart messaging for urgent concerns.    DIET:  We do recommend a small meal at first, but then you may proceed to your regular diet.  Drink plenty of fluids but you should avoid alcoholic beverages for 24 hours.  ACTIVITY:  You should plan to take it easy for the rest of today and you should NOT DRIVE or use heavy machinery until tomorrow (because of the sedation medicines used during the test).    FOLLOW UP: Our staff will call the number listed on your records the next business day following your procedure.  We will call around 7:15- 8:00 am to check on you and address any questions or concerns that you may have regarding the information given to you following your procedure. If we do not reach you, we will leave a message.     If any biopsies were taken you will be contacted by phone or by letter within the next 1-3 weeks.  Please call us at [phone] if you have not heard about the biopsies in 3 weeks.    SIGNATURES/CONFIDENTIALITY: You  and/or your care partner have signed paperwork which will be entered into your electronic medical record.  These signatures attest to the fact that that the information above on your After Visit Summary has been reviewed and is understood.  Full responsibility of the confidentiality of this discharge information lies with you and/or your care-partner.

## 2022-08-03 NOTE — Progress Notes (Signed)
Called to room to assist during endoscopic procedure.  Patient ID and intended procedure confirmed with present staff. Received instructions for my participation in the procedure from the performing physician.  

## 2022-08-03 NOTE — Progress Notes (Signed)
Referring Provider: Sonia Side., FNP Primary Care Physician:  Sonia Side., FNP   Indication for EGD:  Evaluation of periampullary nodule seen on EUS Indication for Colonoscopy:  History of colon polyps    IMPRESSION:  History of colon polyps Periampullary nodule seen on EUS Appropriate candidate for monitored anesthesia care  PLAN: EGD and Colonoscopy in the [facility] today   HPI: [name] is a 76 y.o. male presents for endoscopic evaluation. Initially found to have an elevated amylase and lipase. Non-contrast CT scan ordered and no pancreas findings were reported but it did show a scapular lesion and this was concerning given history of renal cell cancer.  Subsequent MRCP showed no evidence of acute pancreatitis or explanation for the elevated pancreatic enzymes.  There was mild chronic CBD and pancreatic duct dilation without obstructive stones or mass.  There was possible cholelithiasis. He subsequently underwent an EUS by Dr. Ardis Hughs in June 2023 with findings of chronic pancreatitis and a 1 cm periampullary nodule which was biopsied.  Biopsy showed inflammation, focal foveolar metaplasia and reactive changes.  No evidence for malignancy.  Serum IgG4 was normal. He has never consumed etoh.   EUS in June 2023 showed  - Changes of chronic pancreatitis.  - 1 cm periampullary nodule that does not show signs of deeper mass, no signs of involvement with the muscularis propria.  I suspect this is a periampullary duodenal polyp, I biopsied it to exclude cancer.  - Unclear if either of these findings explain his chronically elevated amylase and lipase (several hundreds).  He has chronic renal insufficiency (Cr 3.7), perhaps that is playing a role?  Pathology results showed:  A. MAJOR PAPILLA, BIOPSY:  - Ampullary mucosa with inflammation, focal foveolar metaplasia and  reactive changes  - No evidence of dysplasia or malignancy in the submitted specimen    Previous GI  Evaluation    Colonoscopy 2014 - [facility] Complete exam, good prep. Transverse colon polyp ( 3 mm) removed.    June 2023 EUS - Changes of chronic pancreatitis. - 1 cm periampullary nodule that does not show signs of deeper mass, no signs of involvement with the muscularis propria. I suspect this is a periampullary duodenal polyp, I biopsied it to exclude cancer. - Unclear if either of these findings explain his chronically elevated amylase and lipase (several hundreds). He has chronic renal insufficiency (Cr 3.7), perhaps that is playing a role?   A. 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   T2DM (type 2 diabetes mellitus) ([place])     Past Surgical History:  Procedure Laterality Date   BIOPSY  01/26/2022   Procedure: BIOPSY;  Surgeon: Milus Banister, MD;  Location: [facility];  Service: Gastroenterology;;   COLONOSCOPY  04/17/12   Poor prep   COLONOSCOPY  12/20/12  Repeat, 1 Polyp - needs 50yrf/u   ESOPHAGOGASTRODUODENOSCOPY (EGD) WITH PROPOFOL N/A 01/26/2022   Procedure: ESOPHAGOGASTRODUODENOSCOPY (EGD) WITH PROPOFOL;  Surgeon: JMilus Banister MD;  Location: [facility];  Service: Gastroenterology;  Laterality: N/A;   EUS N/A 01/26/2022   Procedure: UPPER ENDOSCOPIC ULTRASOUND (EUS) RADIAL;  Surgeon: JMilus Banister MD;  Location: [facility];  Service: Gastroenterology;  Laterality: N/A;   FOOT SURGERY     NEPHRECTOMY      Current  Outpatient Medications  Medication Sig Dispense Refill   acetaminophen (TYLENOL) 650 MG CR tablet Take 650 mg by mouth daily.     atorvastatin (LIPITOR) 40 MG tablet Take 40 mg by mouth at bedtime.     famotidine (PEPCID) 20 MG tablet Take 20 mg by mouth 2 (two) times daily.     furosemide (LASIX) 40 MG tablet Take 40 mg by mouth daily.     hydrALAZINE (APRESOLINE) 100 MG tablet Take 100 mg by mouth 2 (two) times daily.     isosorbide mononitrate (IMDUR) 30 MG 24 hr tablet Take 30 mg by mouth daily.     Multiple Vitamins-Minerals (ALIVE MENS 50+ PO) Take 1 tablet by mouth daily. (Patient not taking: Reported on 07/17/2022)     OVER THE COUNTER MEDICATION Take 1 capsule by mouth 3 (three) times daily with meals. Kidney Factor (promotes kidney health) (Patient not taking: Reported on 07/17/2022)     pantoprazole (PROTONIX) 40 MG tablet Take 40 mg by mouth 2 (two) times daily.     primidone (MYSOLINE) 50 MG tablet Take 50 mg by mouth at bedtime.     propranolol ER (INDERAL LA) 80 MG 24 hr capsule Take 80 mg by mouth daily. (Patient not taking: Reported on 07/17/2022)     No current facility-administered medications for this visit.    Allergies as of 08/03/2022   (No Known Allergies)    Family History  Problem Relation Age of Onset   Asthma Mother    Cancer Maternal Grandmother    Cancer Maternal Grandfather    Colon cancer Neg Hx    Stomach cancer Neg Hx    Esophageal cancer Neg Hx    Colon polyps Neg Hx    Crohn's disease Neg Hx    Ulcerative colitis Neg Hx    Rectal cancer Neg Hx      Physical Exam: General:   Alert,  well-nourished, pleasant and cooperative in NAD Head:  Normocephalic and atraumatic. Eyes:  Sclera clear, no icterus.   Conjunctiva pink. Mouth:  No deformity or lesions.   Neck:  Supple; no masses or thyromegaly. Lungs:  Clear throughout to auscultation.   No wheezes. Heart:  Regular rate and rhythm; no murmurs. Abdomen:  Soft, non-tender, nondistended,  normal bowel sounds, no rebound or guarding.  Msk:  Symmetrical. No boney deformities LAD: No inguinal or umbilical LAD Extremities:  No clubbing or edema. Neurologic:  Alert and  oriented x4;  grossly nonfocal Skin:  No obvious rash or bruise. Psych:  Alert and cooperative. Normal mood and affect.     Studies/Results: No results found.    Akyia Borelli L. BTarri Glenn MD, MPH 08/03/2022, 1:30 PM

## 2022-08-03 NOTE — Telephone Encounter (Signed)
-----   Message from Thornton Park, MD sent at 08/03/2022  3:04 PM EST ----- Hope you might help me with an EGD to remove a large gastric polyp with a broad base that seems to be bleeding. He also had a periampullary nodule identified by Ardis Hughs on EUS in June. Biopsies were benign, but, on follow-up EGD today the process looks more extensive. I am going to repeat his CT scan, but, was hoping you might consider EGD +/- EUS.   Jalaysha Skilton, Please schedule CT pancreatic protocol. Thanks.  KLB

## 2022-08-03 NOTE — Telephone Encounter (Signed)
Order placed & schedulers notified.

## 2022-08-03 NOTE — Op Note (Signed)
[place] Patient Name: Brain Honeycutt Procedure Date: 08/03/2022 2:20 PM MRN: [id number] Endoscopist: Thornton Park MD, MD, [id number] Age: 76 Referring MD:  Date of Birth: 01-Feb-1946 Gender: Male Account #: [id number] Procedure:                Colonoscopy Indications:              Surveillance: Personal history of adenomatous                            polyps on last colonoscopy > 5 years ago                           Colonoscopy in 2014 at [place] - one transverse                            colon polyp Medicines:                Monitored Anesthesia Care Procedure:                Pre-Anesthesia Assessment:                           - Prior to the procedure, a History and Physical                            was performed, and patient medications and                            allergies were reviewed. The patient's tolerance of                            previous anesthesia was also reviewed. The risks                            and benefits of the procedure and the sedation                            options and risks were discussed with the patient.                            All questions were answered, and informed consent                            was obtained. Prior Anticoagulants: The patient has                            taken no anticoagulant or antiplatelet agents. ASA                            Grade Assessment: III - A patient with severe                            systemic disease. After reviewing the risks and  benefits, the patient was deemed in satisfactory                            condition to undergo the procedure.                           After obtaining informed consent, the colonoscope                            was passed under direct vision. Throughout the                            procedure, the patient's blood pressure, pulse, and                            oxygen saturations were monitored continuously. The                             CF [id number] #[id number] was introduced through the anus                            and advanced to the the cecum, identified by                            appendiceal orifice and ileocecal valve. The                            colonoscopy was performed without difficulty. The                            patient tolerated the procedure well. The quality                            of the bowel preparation was good. The ileocecal                            valve, appendiceal orifice, and rectum were                            photographed. Scope In: 2:41:21 PM Scope Out: 3:01:20 PM Scope Withdrawal Time: 0 hours 15 minutes 40 seconds  Total Procedure Duration: 0 hours 19 minutes 59 seconds  Findings:                 The perianal and digital rectal examinations were                            normal.                           Multiple medium-mouthed and small-mouthed                            diverticula were found in the sigmoid colon and  descending colon.                           Three sessile polyps were found in the transverse                            colon. The polyps were 2 to 5 mm in size. These                            polyps were removed with a cold snare. Resection                            and retrieval were complete. Estimated blood loss                            was minimal.                           A 2 mm polyp was found in the ascending colon. The                            polyp was sessile. The polyp was removed with a                            cold snare. Resection and retrieval were complete.                            Estimated blood loss was minimal.                           The exam was otherwise without abnormality on                            direct and retroflexion views. Complications:            No immediate complications. Estimated Blood Loss:     Estimated blood loss was minimal. Impression:               -  Diverticulosis in the sigmoid colon and in the                            descending colon.                           - Three 2 to 5 mm polyps in the transverse colon,                            removed with a cold snare. Resected and retrieved.                           - One 2 mm polyp in the ascending colon, removed                            with a cold snare. Resected and retrieved.                           -  The examination was otherwise normal on direct                            and retroflexion views. Recommendation:           - Patient has a contact number available for                            emergencies. The signs and symptoms of potential                            delayed complications were discussed with the                            patient. Return to normal activities tomorrow.                            Written discharge instructions were provided to the                            patient.                           - High fiber diet.                           - Continue present medications.                           - Await pathology results.                           - Repeat colonoscopy is not recommended due to                            current age (46 years or older) for surveillance.                           - Emerging evidence supports eating a diet of                            fruits, vegetables, grains, calcium, and yogurt                            while reducing red meat and alcohol may reduce the                            risk of colon cancer. Thornton Park MD, MD 08/03/2022 3:15:36 PM This report has been signed electronically.

## 2022-08-04 ENCOUNTER — Telehealth: Payer: Self-pay

## 2022-08-04 NOTE — Telephone Encounter (Signed)
Follow up call placed, VM obtained and voice mailbox has not been set up.  RN unable to leave a message.

## 2022-08-08 ENCOUNTER — Other Ambulatory Visit: Payer: Self-pay

## 2022-08-08 ENCOUNTER — Telehealth: Payer: Medicare HMO

## 2022-08-08 DIAGNOSIS — K317 Polyp of stomach and duodenum: Secondary | ICD-10-CM

## 2022-08-08 DIAGNOSIS — K869 Disease of pancreas, unspecified: Secondary | ICD-10-CM

## 2022-08-08 NOTE — Telephone Encounter (Signed)
-----   Message from Irving Copas., MD sent at 08/08/2022  5:43 AM EST ----- KB, Agree with CT scan. Forward me the results when the pathology returns. Okay to move forward if you feel the polyp is a source of bleeding or iron deficiency for attempt at removal. Recommend getting CBC and iron/TIBC/ferritin labs. EUS only if there are abnormalities on CT scan and on your biopsies. Thanks. GM  Colletta Spillers, Please schedule EGD/EUS 75-minute slot we will cancel the EUS if there are no abnormalities on the CT scan or biopsies. Thanks. GM ----- Message ----- From: Thornton Park, MD Sent: 08/03/2022   3:05 PM EST To: Irving Copas., MD; Carl Best, RN  [facility] you might help me with an EGD to remove a large gastric polyp with a broad base that seems to be bleeding. He also had a periampullary nodule identified by Ardis Hughs on EUS in June. Biopsies were benign, but, on follow-up EGD today the process looks more extensive. I am going to repeat his CT scan, but, was hoping you might consider EGD +/- EUS.   Kaira, Please schedule CT pancreatic protocol. Thanks.  KLB

## 2022-08-08 NOTE — Telephone Encounter (Signed)
EGD EUS has been scheduled for 09/20/22 at 1030 am at [facility] with GM   Left message on machine to call back

## 2022-08-08 NOTE — Progress Notes (Signed)
Subjective:   Patient ID: [name], male   DOB: 76 y.o.   MRN: [id number]   HPI Chief Complaint  Patient presents with   Foot Orthotics    Routine foot care, orthotics for right foot      76 y.o. male presents with the above concerns.  He states been using toe On the right fourth toe which has been helping.  Presents today for orthotic management.  No open lesions.        Objective:  Physical Exam  General: AAO x3, NAD  Dermatological: There is no open lesions noted bilaterally.  There is a preulcerative lesion at the distal aspect of the right fourth toe.  No ongoing ulceration drainage or signs of infection  Vascular: Dorsalis Pedis artery and Posterior Tibial artery pedal pulses are decreased on the right however there is moderate edema present in the right foot worse than left.   There is no pain with calf compression, swelling, warmth, erythema.   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  The fourth toe on the right foot is starting to turn and as well as the fifth toe.  Is tenderness mostly in the callus at the tip of the fourth toe.  Muscular strength 5/5 in all groups tested bilateral.  Gait: Unassisted, Nonantalgic.       Assessment:   History of toe potation's due to trauma right foot, toe deformity resulted in hyperkeratotic lesion, swelling     Plan:  -Treatment options discussed including all alternatives, risks, and complications -Sharply debrided the hyperkeratotic lesion without any complications or bleeding as a courtesy as it is quite minimal today.  Dispensed offloading pads. -Compression socks, elevation -Measured for inserts, toe filler right foot.    Trula Slade DPM

## 2022-08-09 ENCOUNTER — Other Ambulatory Visit: Payer: Self-pay

## 2022-08-09 ENCOUNTER — Telehealth: Payer: Self-pay

## 2022-08-09 DIAGNOSIS — K869 Disease of pancreas, unspecified: Secondary | ICD-10-CM

## 2022-08-09 NOTE — Telephone Encounter (Signed)
Patient called states he is having some type of pain on the left side when he has his first meal of the day.

## 2022-08-09 NOTE — Telephone Encounter (Signed)
CT is scheduled for 08/18/22 at 7:30 am at [facility], arrive by 7:15 am, NPO 4 hours prior. Pt is aware & was able to write down and verbalize all instructions. He's been advised that once he has CT scan done, Dr. Tarri Glenn will provide her recommendations for next steps.

## 2022-08-09 NOTE — Telephone Encounter (Signed)
Left message on machine to call back on mobile number   No answer no voice mail on home number

## 2022-08-10 NOTE — Telephone Encounter (Signed)
Left message on machine to call back  

## 2022-08-10 NOTE — Telephone Encounter (Signed)
See alternate phone note dated 12/14 pt has been advised .

## 2022-08-10 NOTE — Telephone Encounter (Signed)
Refer to result note 08/03/22.

## 2022-08-18 ENCOUNTER — Ambulatory Visit (HOSPITAL_COMMUNITY)
Admission: RE | Admit: 2022-08-18 | Discharge: 2022-08-18 | Disposition: A | Discharge status: Home / Self Care | Payer: Medicare HMO | Source: Ambulatory Visit | Attending: Gastroenterology | Admitting: Gastroenterology

## 2022-08-18 ENCOUNTER — Encounter (HOSPITAL_COMMUNITY): Payer: Self-pay

## 2022-08-18 ENCOUNTER — Telehealth: Payer: Self-pay

## 2022-08-18 DIAGNOSIS — K869 Disease of pancreas, unspecified: Secondary | ICD-10-CM

## 2022-08-18 NOTE — Progress Notes (Signed)
Patient currently see's Dr. Hollie Salk with [facility], patient had lab work at their office last week. Creatine 3.96 GFR 15 Dr. Hollie Salk will need to approve for patient to receive IV contrast.

## 2022-08-18 NOTE — Telephone Encounter (Signed)
Kim from [facility] radiology stated patient's Creatinine is too high & they are unable to do the CT with IV contrast, and that PO contrast may not be helpful for looking at the pancreatic lesion. CT has been cancelled. Will route to Dr. Tarri Glenn to advise when she returns to office.

## 2022-08-23 ENCOUNTER — Other Ambulatory Visit: Payer: Self-pay

## 2022-08-23 DIAGNOSIS — K869 Disease of pancreas, unspecified: Secondary | ICD-10-CM

## 2022-08-23 NOTE — Telephone Encounter (Signed)
New orders placed & schedulers notified.

## 2022-09-02 ENCOUNTER — Ambulatory Visit (HOSPITAL_COMMUNITY): Admission: RE | Admit: 2022-09-02 | Payer: Medicare HMO | Source: Ambulatory Visit

## 2022-09-02 ENCOUNTER — Other Ambulatory Visit: Payer: Self-pay | Admitting: Gastroenterology

## 2022-09-02 DIAGNOSIS — K869 Disease of pancreas, unspecified: Secondary | ICD-10-CM

## 2022-09-05 ENCOUNTER — Other Ambulatory Visit (HOSPITAL_COMMUNITY): Payer: Medicare HMO

## 2022-09-11 ENCOUNTER — Ambulatory Visit (HOSPITAL_COMMUNITY)
Admission: RE | Admit: 2022-09-11 | Discharge: 2022-09-11 | Disposition: A | Discharge status: Home / Self Care | Payer: Medicare HMO | Source: Ambulatory Visit | Attending: Gastroenterology | Admitting: Gastroenterology

## 2022-09-11 DIAGNOSIS — K869 Disease of pancreas, unspecified: Secondary | ICD-10-CM | POA: Diagnosis present

## 2022-09-13 ENCOUNTER — Encounter (HOSPITAL_COMMUNITY): Payer: Self-pay | Admitting: Gastroenterology

## 2022-09-20 ENCOUNTER — Other Ambulatory Visit: Payer: Medicare HMO

## 2022-09-20 ENCOUNTER — Encounter: Payer: Self-pay | Admitting: Gastroenterology

## 2022-09-20 ENCOUNTER — Ambulatory Visit (INDEPENDENT_AMBULATORY_CARE_PROVIDER_SITE_OTHER): Payer: Medicare HMO | Admitting: Podiatry

## 2022-09-20 ENCOUNTER — Ambulatory Visit (HOSPITAL_BASED_OUTPATIENT_CLINIC_OR_DEPARTMENT_OTHER): Payer: Medicare HMO | Admitting: Anesthesiology

## 2022-09-20 ENCOUNTER — Encounter (HOSPITAL_COMMUNITY): Payer: Self-pay | Admitting: Gastroenterology

## 2022-09-20 ENCOUNTER — Other Ambulatory Visit: Payer: Self-pay

## 2022-09-20 ENCOUNTER — Encounter (HOSPITAL_COMMUNITY)
Admission: RE | Disposition: A | Discharge status: Home / Self Care | Payer: Self-pay | Source: Home / Self Care | Attending: Gastroenterology

## 2022-09-20 ENCOUNTER — Ambulatory Visit (HOSPITAL_COMMUNITY)
Admission: RE | Admit: 2022-09-20 | Discharge: 2022-09-20 | Disposition: A | Discharge status: Home / Self Care | Payer: Medicare HMO | Attending: Gastroenterology | Admitting: Gastroenterology

## 2022-09-20 ENCOUNTER — Ambulatory Visit (HOSPITAL_COMMUNITY): Payer: Medicare HMO | Admitting: Anesthesiology

## 2022-09-20 DIAGNOSIS — Z89421 Acquired absence of other right toe(s): Secondary | ICD-10-CM

## 2022-09-20 DIAGNOSIS — N189 Chronic kidney disease, unspecified: Secondary | ICD-10-CM | POA: Diagnosis not present

## 2022-09-20 DIAGNOSIS — I129 Hypertensive chronic kidney disease with stage 1 through stage 4 chronic kidney disease, or unspecified chronic kidney disease: Secondary | ICD-10-CM | POA: Diagnosis not present

## 2022-09-20 DIAGNOSIS — K317 Polyp of stomach and duodenum: Secondary | ICD-10-CM

## 2022-09-20 DIAGNOSIS — K2289 Other specified disease of esophagus: Secondary | ICD-10-CM | POA: Insufficient documentation

## 2022-09-20 DIAGNOSIS — K449 Diaphragmatic hernia without obstruction or gangrene: Secondary | ICD-10-CM

## 2022-09-20 DIAGNOSIS — K838 Other specified diseases of biliary tract: Secondary | ICD-10-CM | POA: Diagnosis not present

## 2022-09-20 DIAGNOSIS — E1122 Type 2 diabetes mellitus with diabetic chronic kidney disease: Secondary | ICD-10-CM | POA: Diagnosis not present

## 2022-09-20 DIAGNOSIS — E119 Type 2 diabetes mellitus without complications: Secondary | ICD-10-CM

## 2022-09-20 DIAGNOSIS — K2950 Unspecified chronic gastritis without bleeding: Secondary | ICD-10-CM | POA: Insufficient documentation

## 2022-09-20 DIAGNOSIS — Z89419 Acquired absence of unspecified great toe: Secondary | ICD-10-CM

## 2022-09-20 DIAGNOSIS — K3189 Other diseases of stomach and duodenum: Secondary | ICD-10-CM | POA: Diagnosis not present

## 2022-09-20 DIAGNOSIS — L84 Corns and callosities: Secondary | ICD-10-CM

## 2022-09-20 DIAGNOSIS — R748 Abnormal levels of other serum enzymes: Secondary | ICD-10-CM | POA: Insufficient documentation

## 2022-09-20 DIAGNOSIS — I899 Noninfective disorder of lymphatic vessels and lymph nodes, unspecified: Secondary | ICD-10-CM

## 2022-09-20 DIAGNOSIS — K2980 Duodenitis without bleeding: Secondary | ICD-10-CM

## 2022-09-20 DIAGNOSIS — I10 Essential (primary) hypertension: Secondary | ICD-10-CM

## 2022-09-20 DIAGNOSIS — K869 Disease of pancreas, unspecified: Secondary | ICD-10-CM | POA: Diagnosis not present

## 2022-09-20 DIAGNOSIS — K839 Disease of biliary tract, unspecified: Secondary | ICD-10-CM

## 2022-09-20 HISTORY — PX: ESOPHAGOGASTRODUODENOSCOPY (EGD) WITH PROPOFOL: SHX5813

## 2022-09-20 HISTORY — PX: SUBMUCOSAL LIFTING INJECTION: SHX6855

## 2022-09-20 HISTORY — PX: HEMOSTASIS CLIP PLACEMENT: SHX6857

## 2022-09-20 HISTORY — PX: EUS: SHX5427

## 2022-09-20 HISTORY — PX: ENDOSCOPIC MUCOSAL RESECTION: SHX6839

## 2022-09-20 LAB — GLUCOSE, CAPILLARY
Glucose-Capillary: 66 mg/dL — ABNORMAL LOW (ref 70–99)
Glucose-Capillary: 63 mg/dL — ABNORMAL LOW (ref 70–99)
Glucose-Capillary: 82 mg/dL (ref 70–99)
Glucose-Capillary: 68 mg/dL — ABNORMAL LOW (ref 70–99)
Glucose-Capillary: 76 mg/dL (ref 70–99)

## 2022-09-20 SURGERY — ESOPHAGOGASTRODUODENOSCOPY (EGD) WITH PROPOFOL
Anesthesia: Monitor Anesthesia Care

## 2022-09-20 MED ORDER — PROPOFOL 500 MG/50ML IV EMUL
INTRAVENOUS | Status: AC
  Filled 2022-09-20: qty 50

## 2022-09-20 MED ORDER — LACTATED RINGERS IV SOLN: INTRAVENOUS | Status: DC | PRN

## 2022-09-20 MED ORDER — SUCRALFATE 1 G PO TABS: 1.0000 g | ORAL_TABLET | Freq: Two times a day (BID) | ORAL | 0 refills | Status: DC

## 2022-09-20 MED ORDER — LIDOCAINE 2% (20 MG/ML) 5 ML SYRINGE
INTRAMUSCULAR | Status: DC | PRN
  Administered 2022-09-20: 100 mg via INTRAVENOUS

## 2022-09-20 MED ORDER — PROPOFOL 500 MG/50ML IV EMUL
INTRAVENOUS | Status: DC | PRN
  Administered 2022-09-20: 130 ug/kg/min via INTRAVENOUS

## 2022-09-20 MED ORDER — PROPOFOL 10 MG/ML IV BOLUS
INTRAVENOUS | Status: DC | PRN
  Administered 2022-09-20: 20 mg via INTRAVENOUS

## 2022-09-20 MED ORDER — DEXTROSE 50 % IV SOLN
25.0000 g | INTRAVENOUS | Status: AC
  Administered 2022-09-20: 25 g via INTRAVENOUS
  Filled 2022-09-20: qty 50

## 2022-09-20 MED ORDER — SODIUM CHLORIDE 0.9 % IV SOLN: INTRAVENOUS | Status: DC

## 2022-09-20 SURGICAL SUPPLY — 15 items

## 2022-09-20 NOTE — Discharge Instructions (Signed)
YOU HAD AN ENDOSCOPIC PROCEDURE TODAY: Refer to the procedure report and other information in the discharge instructions given to you for any specific questions about what was found during the examination. If this information does not answer your questions, please call [place] office at [phone] to clarify.  ° °YOU SHOULD EXPECT: Some feelings of bloating in the abdomen. Passage of more gas than usual. Walking can help get rid of the air that was put into your GI tract during the procedure and reduce the bloating. If you had a lower endoscopy (such as a colonoscopy or flexible sigmoidoscopy) you may notice spotting of blood in your stool or on the toilet paper. Some abdominal soreness may be present for a day or two, also. ° °DIET: Your first meal following the procedure should be a light meal and then it is ok to progress to your normal diet. A half-sandwich or bowl of soup is an example of a good first meal. Heavy or fried foods are harder to digest and may make you feel nauseous or bloated. Drink plenty of fluids but you should avoid alcoholic beverages for 24 hours. If you had a esophageal dilation, please see attached instructions for diet.   ° °ACTIVITY: Your care partner should take you home directly after the procedure. You should plan to take it easy, moving slowly for the rest of the day. You can resume normal activity the day after the procedure however YOU SHOULD NOT DRIVE, use power tools, machinery or perform tasks that involve climbing or major physical exertion for 24 hours (because of the sedation medicines used during the test).  ° °SYMPTOMS TO REPORT IMMEDIATELY: °A gastroenterologist can be reached at any hour. Please call [phone]  for any of the following symptoms:  °Following lower endoscopy (colonoscopy, flexible sigmoidoscopy) °Excessive amounts of blood in the stool  °Significant tenderness, worsening of abdominal pains  °Swelling of the abdomen that is new, acute  °Fever of 100° or  higher  °Following upper endoscopy (EGD, EUS, ERCP, esophageal dilation) °Vomiting of blood or coffee ground material  °New, significant abdominal pain  °New, significant chest pain or pain under the shoulder blades  °Painful or persistently difficult swallowing  °New shortness of breath  °Black, tarry-looking or red, bloody stools ° °FOLLOW UP:  °If any biopsies were taken you will be contacted by phone or by letter within the next 1-3 weeks. Call [phone]  if you have not heard about the biopsies in 3 weeks.  °Please also call with any specific questions about appointments or follow up tests. ° °

## 2022-09-20 NOTE — Anesthesia Postprocedure Evaluation (Signed)
Anesthesia Post Note  Patient: ZEDDIE NJIE  Procedure(s) Performed: ESOPHAGOGASTRODUODENOSCOPY (EGD) WITH PROPOFOL UPPER ENDOSCOPIC ULTRASOUND (EUS) RADIAL ENDOSCOPIC MUCOSAL RESECTION SUBMUCOSAL LIFTING INJECTION HEMOSTASIS CLIP PLACEMENT     Patient location during evaluation: PACU Anesthesia Type: MAC Level of consciousness: awake and alert Pain management: pain level controlled Vital Signs Assessment: post-procedure vital signs reviewed and stable Respiratory status: spontaneous breathing, nonlabored ventilation, respiratory function stable and patient connected to nasal cannula oxygen Cardiovascular status: stable and blood pressure returned to baseline Postop Assessment: no apparent nausea or vomiting Anesthetic complications: no  No notable events documented.  Last Vitals:  Vitals:   09/20/22 1242 09/20/22 1249  BP: (!) 98/58 134/74  Pulse: 78 73  Resp: 16 17  Temp: 36.6 C   SpO2: 100% 100%    Last Pain:  Vitals:   09/20/22 1249  TempSrc:   PainSc: 0-No pain                 Swayzie Choate S

## 2022-09-20 NOTE — Transfer of Care (Signed)
Immediate Anesthesia Transfer of Care Note  Patient: SULEMAN GUNNING  Procedure(s) Performed: ESOPHAGOGASTRODUODENOSCOPY (EGD) WITH PROPOFOL UPPER ENDOSCOPIC ULTRASOUND (EUS) RADIAL ENDOSCOPIC MUCOSAL RESECTION SUBMUCOSAL LIFTING INJECTION HEMOSTASIS CLIP PLACEMENT  Patient Location: PACU  Anesthesia Type:MAC  Level of Consciousness: sedated  Airway & Oxygen Therapy: Patient Spontanous Breathing and Patient connected to face mask oxygen  Post-op Assessment: Report given to RN and Post -op Vital signs reviewed and stable  Post vital signs: Reviewed and stable  Last Vitals:  Vitals Value Taken Time  BP    Temp    Pulse    Resp    SpO2      Last Pain:  Vitals:   09/20/22 0904  TempSrc: Temporal         Complications: No notable events documented.

## 2022-09-20 NOTE — H&P (Signed)
GASTROENTEROLOGY PROCEDURE H&P NOTE   Primary Care Physician: Sonia Side., FNP  HPI: [name] is a 77 y.o. male who presents for EGD/EUS to evaluate the periampullary region and also assess a gastric polyp with concern for bleeding risk with potential resection.  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   T2DM (type 2 diabetes mellitus) ([place])    Past Surgical History:  Procedure Laterality Date   BIOPSY  01/26/2022   Procedure: BIOPSY;  Surgeon: Milus Banister, MD;  Location: [facility];  Service: Gastroenterology;;   COLONOSCOPY  04/17/12   Poor prep   COLONOSCOPY  12/20/12   Repeat, 1 Polyp - needs 77yrf/u   ESOPHAGOGASTRODUODENOSCOPY (EGD) WITH PROPOFOL N/A 01/26/2022   Procedure: ESOPHAGOGASTRODUODENOSCOPY (EGD) WITH PROPOFOL;  Surgeon: JMilus Banister MD;  [facility];  Service: Gastroenterology;  Laterality: N/A;   EUS N/A 01/26/2022   Procedure: UPPER ENDOSCOPIC ULTRASOUND (EUS) RADIAL;  Surgeon: JMilus Banister MD;  Location: [facility];  Service: Gastroenterology;  Laterality: N/A;   FOOT SURGERY     NEPHRECTOMY     No current facility-administered medications for this encounter.   No current facility-administered medications for this encounter. No Known Allergies Family History  Problem Relation Age of Onset   Asthma Mother    Cancer Maternal Grandmother    Cancer Maternal Grandfather    Colon cancer Neg Hx    Stomach cancer Neg Hx    Esophageal cancer Neg Hx    Colon polyps Neg Hx    Crohn's disease Neg Hx    Ulcerative colitis Neg Hx    Rectal cancer Neg Hx    Social  History   Socioeconomic History   Marital status: Single    Spouse name: Not on file   Number of children: Not on file   Years of education: Not on file   Highest education level: Not on file  Occupational History   Occupation: retired  Tobacco Use   Smoking status: Never    Passive exposure: Never   Smokeless tobacco: Never  Vaping Use   Vaping Use: Never used  Substance and Sexual Activity   Alcohol use: Never   Drug use: Never   Sexual activity: Not on file  Other Topics Concern   Not on file  Social History Narrative   Not on file   Social Determinants of Health   Financial Resource Strain: Not on file  Food Insecurity: Not on file  Transportation Needs: Not on file  Physical Activity: Not on file  Stress: Not on file  Social Connections: Not on file  Intimate Partner Violence: Not on file    Physical Exam: There were no vitals filed for this visit. There is no height or weight on file to calculate BMI. GEN: NAD EYE: Sclerae anicteric ENT: MMM CV: Non-tachycardic GI: Soft, NT/ND NEURO:  Alert & Oriented x 3  Lab Results: No results for input(s): "WBC", "HGB", "HCT", "PLT" in the last 72 hours. BMET  No results for input(s): "NA", "K", "CL", "CO2", "GLUCOSE", "BUN", "CREATININE", "CALCIUM" in the last 72 hours. LFT No results for input(s): "PROT", "ALBUMIN", "AST", "ALT", "ALKPHOS", "BILITOT", "BILIDIR", "IBILI" in the last 72 hours. PT/INR No results for input(s): "LABPROT", "INR" in the last 72 hours.   Impression / Plan: This is a 77 y.o.male who presents for EGD/EUS to evaluate the periampullary region and also assess a gastric polyp with concern for bleeding risk with potential resection.  The risks of an EUS including intestinal perforation, bleeding, infection, aspiration, and medication effects were discussed as was the possibility it may not give a definitive diagnosis if a biopsy is performed.  When a biopsy of the pancreas is done as part of the  EUS, there is an additional risk of pancreatitis at the rate of about 1-2%.  It was explained that procedure related pancreatitis is typically mild, although it can be severe and even life threatening, which is why we do not perform random pancreatic biopsies and only biopsy a lesion/area we feel is concerning enough to warrant the risk.  The risks and benefits of endoscopic evaluation/treatment were discussed with the patient and/or family; these include but are not limited to the risk of perforation, infection, bleeding, missed lesions, lack of diagnosis, severe illness requiring hospitalization, as well as anesthesia and sedation related illnesses.  The patient's history has been reviewed, patient examined, no change in status, and deemed stable for procedure.  The patient and/or family is agreeable to proceed.    Justice Britain, MD [facility] Office # [phone]

## 2022-09-20 NOTE — Op Note (Addendum)
[facility] Patient Name: [name] Procedure Date: 09/20/2022 MRN: [id number] Attending MD: Justice Britain , MD, [id number] Date of Birth: [birth year] CSN: [id number] Age: 77 Admit Type: Outpatient Procedure:                Upper EUS Indications:              Mucosal mass/polyp involving the major papilla                            (found on endoscopy), Elevated lipase Providers:                Justice Britain, MD, Burtis Junes, RN, Frazier Richards, Technician Referring MD:             Thornton Park MD, MD, Milus Banister, MD, Sonia Side Medicines:                Monitored Anesthesia Care Complications:            No immediate complications. Estimated Blood Loss:     Estimated blood loss was minimal. Procedure:                Pre-Anesthesia Assessment:                           - Prior to the procedure, a History and Physical                            was performed, and patient medications and                            allergies were reviewed. The patient's tolerance of                            previous anesthesia was also reviewed. The risks                            and benefits of the procedure and the sedation                            options and risks were discussed with the patient.                            All questions were answered, and informed consent                            was obtained. Prior Anticoagulants: The patient has                            taken no anticoagulant or antiplatelet agents. ASA  Grade Assessment: III - A patient with severe                            systemic disease. After reviewing the risks and                            benefits, the patient was deemed in satisfactory                            condition to undergo the procedure.                           After obtaining informed consent, the endoscope was                             passed under direct vision. Throughout the                            procedure, the patient's blood pressure, pulse, and                            oxygen saturations were monitored continuously. The                            [id number]-[id number] ([id number]) Olympus therapeutic endoscope                            was introduced through the mouth, and advanced to                            the second part of duodenum. The [id number]-[id number]                            ([id number]) Olympus duodenoscope was introduced                            through the mouth, and advanced to the area of                            papilla. The [id number] ([id number]) Olympus linear                            ultrasound scope was introduced through the mouth,                            and advanced to the duodenum for ultrasound                            examination from the stomach and duodenum. The                            upper EUS was accomplished without difficulty. The  patient tolerated the procedure. Scope In: Scope Out: Findings:      ENDOSCOPIC FINDING: :      No gross lesions were noted in the entire esophagus.      The Z-line was irregular and was found 39 cm from the incisors.      A 2 cm hiatal hernia was present.      Localized moderate mucosal variance characterized by altered coloration       (brown/black) was found in the gastric antrum.      A single 14 mm semi-sessile polyp with no bleeding and stigmata of       recent bleeding was found in the gastric antrum. Preparations were made       for mucosal resection. NBI imaging and White-light endoscopy was done.       Demarcation of the lesion was performed with high-definition white light       and narrow band imaging to clearly identify the boundaries of the       lesion. EverLift was injected to raise the lesion. Snare mucosal       resection was performed. Resection and retrieval were complete. Resected       tissue  margins were examined and clear of polyp tissue. To prevent       bleeding after mucosal resection, three hemostatic clips were       successfully placed (MR conditional). Clip manufacturer: [employer]. There was no bleeding during, or at the end, of the       procedure.      Patchy mildly erythematous mucosa was found in the entire examined       stomach - not rebiopsied since it has been biopsied previously.      The major papilla was normal with a large intraduodenal portion.      Localized mucosal variance characterized by granularity and altered       texture was found superior and lateral to the major papilla itself.       After the rest of the procedure was completed then biopsies were taken       with a cold forceps for histology to rule out dysplastic change.      No gross lesions were noted in the duodenal bulb, in the first portion       of the duodenum and in the second portion of the duodenum.      ENDOSONOGRAPHIC FINDING: :      Endosonographic imaging of the ampulla showed no intramural       (subepithelial) lesion.      Localized wall thickening was visualized endosonographically in the area       just adjacent to the major papilla, within the duodenum. This appeared       to primarily be due to thickening within the deep mucosa (Layer 2). The       thickness of the abnormal layers. The duodenal wall measured up to 6 mm       in thickness.      Pancreatic parenchymal abnormalities were noted in the entire pancreas.       These consisted of lobularity with honeycombing and hyperechoic strands.      The pancreatic duct was dilated and generous in the head/proximal neck       regions. The pancreatic head duct measured 3.4 mm to 5.1 mm measured,       genu of the pancreas duct measured 4.4 mm to  2.8 mm, body of the       pancreas duct measured 0.9 mm and tail of the pancreas duct measured 1.0       mm.      There was dilation in the common bile duct up to 7 mm.  No evidence of       any stones or choledocholithiasis in the bile duct was noted.      Endosonographic imaging in the visualized portion of the liver showed no       mass.      No malignant-appearing lymph nodes were visualized in the celiac region       (level 20), peripancreatic region and porta hepatis region.      The celiac region was visualized. Impression:               EGD impression:                           - No gross lesions in the entire esophagus. Z-line                            irregular, 39 cm from the incisors.                           - 2 cm hiatal hernia.                           - Gastric mucosal variant coloration in the antrum                            noted.                           - A single gastric polyp. Resected and retrieved                            via mucosal resection. Clips (MR conditional) were                            placed. Clip manufacturer: [employer].                           - Erythematous mucosa in the stomach throughout                            (previously biopsied).                           - No other gross lesions in the duodenal bulb, in                            the first portion of the duodenum and in the second                            portion of the duodenum.                           -  Normal major papilla with a large intraduodenal                            portion.                           - Mucosal variance in the area just superior and                            lateral to the major papilla. After EUS completed,                            this area was biopsied to rule out dysplastic                            change.                           EUS impression:                           - Wall thickening was seen in the area of major                            papilla, within the duodenum. It appeared to                            primarily be within the deep mucosa (Layer 2) and                             did not itself look to be involving the ampulla.                           - No evidence of a true intramural ampullary mass                            was noted on today's examination.                           - Pancreatic parenchymal abnormalities consisting                            of lobularity with honeycombing and hyperechoic                            strands were noted in the entire pancreas. The                            patient's pancreas suggests evidence of chronic                            pancreatitis.                           - The pancreatic duct in the pancreatic head  and                            genu were dilated but eventually tapered normally                            towards the distal neck and body and tail of the                            pancreas.                           - There was prominence of the common bile duct but                            no evidence of micro choledocholithiasis.                           - No malignant-appearing lymph nodes were                            visualized in the celiac region (level 20),                            peripancreatic region and porta hepatis region. Moderate Sedation:      Not Applicable - Patient had care per Anesthesia. Recommendation:           - The patient will be observed post-procedure,                            until all discharge criteria are met.                           - Discharge patient to home.                           - Patient has a contact number available for                            emergencies. The signs and symptoms of potential                            delayed complications were discussed with the                            patient. Return to normal activities tomorrow.                            Written discharge instructions were provided to the                            patient.                           - Soft diet today.                           -  Continue PPI twice  daily.                           - Carafate twice daily for 2 weeks.                           - Continue present medications otherwise.                           - Observe patient's clinical course.                           - Await path results.                           - Pending overall pathology if gastric polyp is                            hyperplastic then no additional monitoring or                            follow-up will be required. If there is evidence of                            dysplastic change in the area superior/adjacent to                            the major papilla, then we will need to consider                            endoscopic resection. If no evidence of dysplastic                            change, then recommend repeat imaging with MRI/MRCP                            in 1 year to follow the pancreas duct dilatation,                            unless something else drastically changes.                           - Follow-up with primary GI to further discuss                            symptoms of reflux ongoing for years.                           - The findings and recommendations were discussed                            with the patient.                           - The findings and recommendations  were discussed                            with the patient's family. Procedure Code(s):        --- Professional ---                           [id number], Esophagogastroduodenoscopy, flexible,                            transoral; with endoscopic mucosal resection                           43237, Esophagogastroduodenoscopy, flexible,                            transoral; with endoscopic ultrasound examination                            limited to the esophagus, stomach or duodenum, and                            adjacent structures                           43239, 59, Esophagogastroduodenoscopy, flexible,                            transoral; with biopsy, single or  multiple Diagnosis Code(s):        --- Professional ---                           K22.89, Other specified disease of esophagus                           K44.9, Diaphragmatic hernia without obstruction or                            gangrene                           K31.89, Other diseases of stomach and duodenum                           K31.7, Polyp of stomach and duodenum                           K86.9, Disease of pancreas, unspecified                           I89.9, Noninfective disorder of lymphatic vessels                            and lymph nodes, unspecified                           K83.9, Disease of biliary tract, unspecified  R74.8, Abnormal levels of other serum enzymes                           R93.3, Abnormal findings on diagnostic imaging of                            other parts of digestive tract                           K83.8, Other specified diseases of biliary tract CPT copyright 2022 [employer]. All rights reserved. The codes documented in this report are preliminary and upon coder review may  be revised to meet current compliance requirements. Justice Britain, MD 09/20/2022 1:27:15 PM Number of Addenda: 0

## 2022-09-20 NOTE — Progress Notes (Signed)
Patient given soda and peanutbutter

## 2022-09-20 NOTE — Anesthesia Preprocedure Evaluation (Signed)
Anesthesia Evaluation  Patient identified by MRN, date of birth, ID band Patient awake    Reviewed: Allergy & Precautions, H&P , NPO status , Patient's Chart, lab work & pertinent test results  Airway Mallampati: II  TM Distance: >3 FB Neck ROM: Full    Dental no notable dental hx.    Pulmonary neg pulmonary ROS   Pulmonary exam normal breath sounds clear to auscultation       Cardiovascular hypertension, Normal cardiovascular exam Rhythm:Regular Rate:Normal     Neuro/Psych negative neurological ROS  negative psych ROS   GI/Hepatic negative GI ROS, Neg liver ROS,,,  Endo/Other  diabetes, Type 2    Renal/GU Renal InsufficiencyRenal disease  negative genitourinary   Musculoskeletal negative musculoskeletal ROS (+)    Abdominal   Peds negative pediatric ROS (+)  Hematology negative hematology ROS (+)   Anesthesia Other Findings   Reproductive/Obstetrics negative OB ROS                             Anesthesia Physical Anesthesia Plan  ASA: 2  Anesthesia Plan: MAC   Post-op Pain Management: Minimal or no pain anticipated   Induction: Intravenous  PONV Risk Score and Plan: 1 and Propofol infusion and Treatment may vary due to age or medical condition  Airway Management Planned: Simple Face Mask  Additional Equipment:   Intra-op Plan:   Post-operative Plan:   Informed Consent: I have reviewed the patients History and Physical, chart, labs and discussed the procedure including the risks, benefits and alternatives for the proposed anesthesia with the patient or authorized representative who has indicated his/her understanding and acceptance.     Dental advisory given  Plan Discussed with: CRNA and Surgeon  Anesthesia Plan Comments:        Anesthesia Quick Evaluation

## 2022-09-20 NOTE — Progress Notes (Signed)
Patient presents today to pick up custom molded foot orthotics recommended by Dr. Jacqualyn Posey.   Orthotics were dispensed and fit was satisfactory. Reviewed instructions for break-in and wear. Written instructions given to patient.  Patient will follow up as needed.   [facility] - order # [id number]

## 2022-09-22 ENCOUNTER — Encounter: Payer: Self-pay | Admitting: Gastroenterology

## 2022-09-22 LAB — SURGICAL PATHOLOGY

## 2022-09-28 ENCOUNTER — Telehealth: Payer: Self-pay

## 2022-09-28 NOTE — Telephone Encounter (Signed)
Called and spoke with patient regarding scheduling a f/u appt. Pt has been scheduled for an appt with Tye Savoy, NP on Thursday, 10/26/22 at 11 am. Pt is aware that I will mail his appt information, he confirmed address on file. We also reviewed recent pathology results. Pt knows to expect a letter in the mail with results as well. Pt verbalized understanding of all information and had no concerns at the end of the call.

## 2022-09-28 NOTE — Telephone Encounter (Signed)
-----  Message from Thornton Park, MD sent at 09/25/2022  8:57 PM EST ----- Please arrange office follow-up with [facility].  Thanks.  KLB ----- Message ----- From: Timothy Lasso, RN Sent: 09/25/2022  10:09 AM EST To: Willia Craze, NP; Thornton Park, MD

## 2022-10-26 ENCOUNTER — Encounter: Payer: Self-pay | Admitting: Nurse Practitioner

## 2022-10-26 ENCOUNTER — Ambulatory Visit: Payer: Medicare HMO | Admitting: Nurse Practitioner

## 2022-10-26 VITALS — BP 106/60 | HR 97 | Ht 72.0 in | Wt 221.0 lb

## 2022-10-26 DIAGNOSIS — K219 Gastro-esophageal reflux disease without esophagitis: Secondary | ICD-10-CM

## 2022-10-26 DIAGNOSIS — K861 Other chronic pancreatitis: Secondary | ICD-10-CM | POA: Diagnosis not present

## 2022-10-26 NOTE — Progress Notes (Signed)
Assessment    # 77 yo male with chronic pancreatitis of unclear etiology.  Currently asymptomatic.   # Large gastric polyp and periampullary lesion. Both lesions removed by Dr. Rush Landmark in Jan 2024.  Gastric polyp was hyperplastic.  Periampullary lesion biopsies showed reactive duodenal mucosa with gastric metaplasia compatible with peptic duodenitis.    # GERD.  Patient isn't clear what he is taking for GERD. Has BID pantoprazole and BID pepcid on home med list. He will clarify and call us back later today so we can update records.   # Chronic active gastritis on biopsies in January 2024. H.pylori negative.   # History of adenomatous colon polyps December 2023 colon polyps.   # History of renal cell cancer, status post right nephrectomy  # Cholelithiasis  Plan   -No plans for polyp surveillance colonoscopy given age -Patient will call us back to clarify GERD medications.  He should be on pantoprazole 40 mg BID.  - One year MRI / MRCP    HPI    Chief complaint: Follow up after EGD   [name] is a 77 y.o. male known to Dr. Tarri Glenn with a past medical history of GERD, chronic pancreatitis, colon polyps, cholelithiasis. See PMH / [place] for additional details.   I saw [name] in the office Feb 2023 for a chronically elevated amylase and lipase (he had been seen prior to that in 2021 but was lost to follow-up).  He had an EUS in June 2023 showing chronic pancreatitis.  Etiology unknown.  At the time of EUS a periampullary nodule was found. Biopsies negative for dysplasia or malignancy.  Patient has a history of colon polyps and was due for a surveillance colonoscopy.  For reevaluation of the periampullary nodule he underwent an EGD at the time of surveillance colonoscopy in December 2023.  EGD showed a large sessile polyp with evidence for recent bleeding in the gastric body.  A single medium size carpet like polyp with no bleeding was found in the ampulla.  Biopsies of both  lesions were negative for cancer  He had 4 colon polyps removed (3 were tubular adenomas).  He subsequently underwent EUS by Dr. Rush Landmark with findings as below. The gastric polyp was removed and was hyperplastic.  Periampullary lesion was removed.  Biopsies showed reactive duodenal mucosa with gastric metaplasia compatible with peptic  duodenitis.  Dr. Rush Landmark recommended a follow-up MRI/MRCP in 1 year.  Patient is here for follow-up.   [name] feels okay.  He completed the 2 weeks of Carafate recommended by Dr. Rush Landmark.  He has both twice daily pantoprazole and twice daily famotidine on his home list.  He is not familiar enough with his medications to know which reflux medications that he is on. Other than 1 episode of reflux a couple weeks ago, he has not had any significant GERD symptoms.  No abdominal pain.   09/20/22 EGD / EUS EGD impression: - No gross lesions in the entire esophagus. Z-line irregular, 39 cm from the incisors. - 2 cm hiatal hernia. - Gastric mucosal variant coloration in the antrum noted. - A single gastric polyp. Resected and retrieved via mucosal resection. Clips (MR conditional) were placed. Clip manufacturer: [employer]. - Erythematous mucosa in the stomach throughout (previously biopsied). - No other gross lesions in the duodenal bulb, in the first portion of the duodenum and in the second portion of the duodenum. - Normal major papilla with a large intraduodenal portion. - Mucosal variance in the  area just superior and lateral to the major papilla. After EUS completed, this area was biopsied to rule out dysplastic change.   EUS impression: - Wall thickening was seen in the area of major papilla, within the duodenum. It appeared to primarily be within the deep mucosa (Layer 2) and did not itself look to be involving the ampulla. - No evidence of a true intramural ampullary mass was noted on today's examination. - Pancreatic parenchymal abnormalities consisting of  lobularity with honeycombing and hyperechoic strands were noted in the entire pancreas. The patient's pancreas suggests evidence of chronic pancreatitis. - The pancreatic duct in the pancreatic head and genu were dilated but eventually tapered normally towards the distal neck and body and tail of the pancreas. - There was prominence of the common bile duct but no evidence of micro choledocholithiasis. - No malignant-appearing lymph nodes were visualized in the celiac region (level 20), peripancreatic region and porta hepatis region.  FINAL MICROSCOPIC DIAGNOSIS:   A. STOMACH, POLYPECTOMY:  Hyperplastic polyp with surface erosion and chronic active gastritis  Helicobacter stain negative (I[facility], adequate control)  Negative for intestinal metaplasia, dysplasia and carcinoma   B. PERIAMPULAR LESIONS, BIOPSY:  Reactive duodenal mucosa with gastric metaplasia compatible with peptic  duodenitis  Negative for dysplasia and carcinoma    Labs:     Latest Ref Rng & Units 04/23/2022   12:18 AM 10/12/2021    9:55 AM 09/08/2019   11:38 AM  CBC  WBC 4.0 - 10.5 K/uL 6.0  5.4  4.7   Hemoglobin 13.0 - 17.0 g/dL 9.3  10.2  11.8   Hematocrit 39.0 - 52.0 % 27.5  30.9  35.6   Platelets 150 - 400 K/uL 177  190.0  174.0        Latest Ref Rng & Units 09/08/2019   11:38 AM 12/07/2011    3:11 PM  Hepatic Function  Total Protein 6.0 - 8.3 g/dL 7.4  7.3   Albumin 3.5 - 5.2 g/dL 4.2  4.3   AST 0 - 37 U/L 18  28   ALT 0 - 53 U/L 22  33   Alk Phosphatase 39 - 117 U/L 69  56   Total Bilirubin 0.2 - 1.2 mg/dL 0.5  0.4     Past Medical History:  Diagnosis Date   Allergy    "POLLEN"   Arthritis    LEFT HIP,HAD SHOTS,BACK   Cancer ([place])    prostate /renal ca   Cataract    BILATERAL   CKD (chronic kidney disease)    GERD (gastroesophageal reflux disease)    Glaucoma    Hyperlipidemia    Hypertension    Neuromuscular disorder (HCC)    RIGHT FOOT   Renal insufficiency    kidney cancer 20 yrs ago - R  kidney removed   T2DM (type 2 diabetes mellitus) ([place])     Past Surgical History:  Procedure Laterality Date   BIOPSY  01/26/2022   Procedure: BIOPSY;  Surgeon: Milus Banister, MD;  Location: [facility];  Service: Gastroenterology;;   COLONOSCOPY  04/17/12   Poor prep   COLONOSCOPY  12/20/12   Repeat, 1 Polyp - needs 54yrf/u   ENDOSCOPIC MUCOSAL RESECTION  09/20/2022   Procedure: ENDOSCOPIC MUCOSAL RESECTION;  Surgeon: MIrving Copas, MD;  [facility];  Service: Gastroenterology;;   ESOPHAGOGASTRODUODENOSCOPY (EGD) WITH PROPOFOL N/A 01/26/2022   Procedure: ESOPHAGOGASTRODUODENOSCOPY (EGD) WITH PROPOFOL;  Surgeon: JMilus Banister MD;  [facility];  Service: Gastroenterology;  Laterality: N/A;   ESOPHAGOGASTRODUODENOSCOPY (EGD) WITH PROPOFOL N/A 09/20/2022   Procedure: ESOPHAGOGASTRODUODENOSCOPY (EGD) WITH PROPOFOL;  Surgeon: Rush Landmark Telford Nab., MD;  Location: [facility];  Service: Gastroenterology;  Laterality: N/A;   EUS N/A 01/26/2022   Procedure: UPPER ENDOSCOPIC ULTRASOUND (EUS) RADIAL;  Surgeon: Milus Banister, MD;  Location: [facility];  Service: Gastroenterology;  Laterality: N/A;   EUS N/A 09/20/2022   Procedure: UPPER ENDOSCOPIC ULTRASOUND (EUS) RADIAL;  Surgeon: Irving Copas., MD;  Location: [facility];  Service: Gastroenterology;  Laterality: N/A;   FOOT SURGERY     HEMOSTASIS CLIP PLACEMENT  09/20/2022   Procedure: HEMOSTASIS CLIP PLACEMENT;  Surgeon: Rush Landmark Telford Nab., MD;  Location: [facility];  Service: Gastroenterology;;   NEPHRECTOMY     SUBMUCOSAL LIFTING INJECTION  09/20/2022   Procedure: SUBMUCOSAL LIFTING INJECTION;  Surgeon: Irving Copas., MD;  Location: [facility];  Service: Gastroenterology;;    Current Medications, Allergies, Family History and Social History were reviewed in [facility] record.     Current Outpatient Medications  Medication Sig Dispense Refill    acetaminophen (TYLENOL) 650 MG CR tablet Take 650 mg by mouth daily.     atorvastatin (LIPITOR) 40 MG tablet Take 40 mg by mouth at bedtime.     famotidine (PEPCID) 20 MG tablet Take 20 mg by mouth 2 (two) times daily.     furosemide (LASIX) 40 MG tablet Take 40 mg by mouth every other day.     hydrALAZINE (APRESOLINE) 100 MG tablet Take 100 mg by mouth 2 (two) times daily.     isosorbide mononitrate (IMDUR) 30 MG 24 hr tablet Take 30 mg by mouth daily.     Naphazoline HCl (CLEAR EYES OP) Place 1 drop into both eyes daily as needed (burning).     OVER THE COUNTER MEDICATION Take 2 tablets by mouth daily. NeuroQ     pantoprazole (PROTONIX) 40 MG tablet Take 40 mg by mouth 2 (two) times daily.     primidone (MYSOLINE) 50 MG tablet Take 200 mg by mouth at bedtime.     sennosides-docusate sodium (SENOKOT-S) 8.6-50 MG tablet Take 2 tablets by mouth daily as needed for constipation.     sucralfate (CARAFATE) 1 g tablet Take 1 tablet (1 g total) by mouth 2 (two) times daily for 14 days. 28 tablet 0   Current Facility-Administered Medications  Medication Dose Route Frequency Provider Last Rate Last Admin   0.9 %  sodium chloride infusion  500 mL Intravenous Once Thornton Park, MD        Review of Systems: No chest pain. No shortness of breath. No urinary complaints.    Physical Exam  Wt Readings from Last 3 Encounters:  09/20/22 223 lb (101.2 kg)  08/03/22 227 lb (103 kg)  07/17/22 227 lb (103 kg)    BP 106/60   Pulse 97   Ht 6' (1.829 m)   Wt 221 lb (100.2 kg)   BMI 29.97 kg/m  Constitutional:  Pleasant, generally well appearing male in no acute distress. Psychiatric: Normal mood and affect. Behavior is normal. EENT: Pupils normal.  Conjunctivae are normal. No scleral icterus. Neck supple.  Cardiovascular: Normal rate, regular rhythm.  Pulmonary/chest: Effort normal and breath sounds normal. No wheezing, rales or rhonchi. Abdominal: Soft, nondistended, nontender. Bowel sounds  active throughout. There are no masses palpable. No hepatomegaly. Neurological: Alert and oriented to person place and time.  Skin: Skin is warm and dry. No rashes noted.  Tye Savoy, NP  10/26/2022, 11:15 AM I spent 35 minutes total reviewing records, obtaining history, performing exam, counseling patient and documenting visit / findings.   Cc:  Sonia Side., FNP

## 2022-10-26 NOTE — Patient Instructions (Signed)
Follow up in 6 months  Call office to let us know which acid reflux medications you are taking   If your blood pressure at your visit was 140/90 or greater, please contact your primary care physician to follow up on this.  _______________________________________________________  If you are age 75 or older, your body mass index should be between 23-30. Your Body mass index is 29.97 kg/m. If this is out of the aforementioned range listed, please consider follow up with your Primary Care Provider.  If you are age 33 or younger, your body mass index should be between 19-25. Your Body mass index is 29.97 kg/m. If this is out of the aformentioned range listed, please consider follow up with your Primary Care Provider.   ________________________________________________________  The [facility] providers would like to encourage you to use [facility] to communicate with providers for non-urgent requests or questions.  Due to long hold times on the telephone, sending your provider a message by [facility] may be a faster and more efficient way to get a response.  Please allow 48 business hours for a response.  Please remember that this is for non-urgent requests.  _______________________________________________________   Thank you for entrusting me with your care and choosing [facility].  Tye Savoy NP

## 2022-11-09 ENCOUNTER — Telehealth: Payer: Self-pay

## 2022-11-09 NOTE — Telephone Encounter (Signed)
Called patient to discuss and arrange for fecal elastase. No answer. Left a voicemail and requested a return call.

## 2022-11-09 NOTE — Telephone Encounter (Signed)
-----   Message from Willia Craze, NP sent at 11/01/2022  4:48 PM EST ----- Beth, no rush on this.   Patient recently diagnosed by EUS with chronic pancreatitis. Dr. Rush Landmark recommends collecting a fecal elastase at some point if he agrees and you will arrange. Thanks

## 2022-11-13 ENCOUNTER — Other Ambulatory Visit: Payer: Self-pay

## 2022-11-13 DIAGNOSIS — K861 Other chronic pancreatitis: Secondary | ICD-10-CM

## 2022-11-13 NOTE — Telephone Encounter (Signed)
Attempted phone contact. No answer. Left voicemail.

## 2022-11-15 NOTE — Telephone Encounter (Signed)
Call to the patient. Telephone line has busy signal.

## 2022-12-05 NOTE — Telephone Encounter (Signed)
Spoke with patient. He reports he is doing well. Agrees to come by our lab for the fecal elastase.

## 2022-12-06 ENCOUNTER — Telehealth: Payer: Self-pay | Admitting: *Deleted

## 2022-12-06 NOTE — Progress Notes (Signed)
Reviewed and agree with management plans. Stool for pancreatic elastase was also recommended.   Nash Bolls L. Orvan Falconer, MD, MPH

## 2022-12-06 NOTE — Telephone Encounter (Signed)
-----   Message from Tressia Danas, MD sent at 12/06/2022  6:32 AM EDT ----- The patient has not yet submitted the stool study. Would you please remind him to do so?  Thank you.  KLB ----- Message ----- From: Lemar Lofty., MD Sent: 10/29/2022   5:45 AM EDT To: Meredith Pel, NP; Tressia Danas, MD  PG, The only other thing I would think about would be to consider adding on or doing a fecal elastase since his findings on EUS were suggesting the potential of chronic pancreatitis and see if there may be anything in that perspective. Thanks. GM ----- Message ----- From: Meredith Pel, NP Sent: 10/26/2022  12:31 PM EST To: Lemar Lofty., MD; #  I saw this patient in clinic today after recent EUS.  I sent my note to both of you.  Is there anything I did not address?  I will ask the nurse to put him on for a 1 year recall MRI/MRCP.  They were going to need to diagnoses, is that for follow-up of chronic pancreatitis ? Thanks

## 2022-12-06 NOTE — Telephone Encounter (Signed)
Call back unable to leave message voicemail box not set up.

## 2022-12-06 NOTE — Telephone Encounter (Signed)
Patient states he will complete the stool sample today.

## 2022-12-07 ENCOUNTER — Other Ambulatory Visit: Payer: Medicare HMO

## 2022-12-07 DIAGNOSIS — K861 Other chronic pancreatitis: Secondary | ICD-10-CM

## 2022-12-10 ENCOUNTER — Other Ambulatory Visit: Payer: Self-pay | Admitting: Acute Care

## 2022-12-10 DIAGNOSIS — R911 Solitary pulmonary nodule: Secondary | ICD-10-CM

## 2022-12-13 LAB — PANCREATIC ELASTASE, FECAL: Pancreatic Elastase-1, Stool: >500 mcg/g

## 2022-12-14 ENCOUNTER — Other Ambulatory Visit: Payer: Self-pay | Admitting: *Deleted

## 2022-12-14 DIAGNOSIS — R911 Solitary pulmonary nodule: Secondary | ICD-10-CM

## 2022-12-25 ENCOUNTER — Other Ambulatory Visit: Payer: Self-pay | Admitting: Family

## 2022-12-25 DIAGNOSIS — R55 Syncope and collapse: Secondary | ICD-10-CM

## 2022-12-25 DIAGNOSIS — R42 Dizziness and giddiness: Secondary | ICD-10-CM

## 2022-12-27 ENCOUNTER — Telehealth: Payer: Self-pay | Admitting: *Deleted

## 2022-12-27 NOTE — Telephone Encounter (Signed)
FYI: Received a call from Al, a clinical review nurse for [employer] regarding Chest CT w/o that is scheduled for 01/05/23. He could not approve CT because of notation on Chest CT from 05/31/22 that stated no suspicious nodules. He stated he would send this to Dr Irineo Axon on the review board to see if a peer to peer is needed. Dr Irineo Axon will call back if this is needed.  Case # [id number]

## 2023-01-01 ENCOUNTER — Other Ambulatory Visit: Payer: Medicare HMO

## 2023-01-02 ENCOUNTER — Other Ambulatory Visit: Payer: Self-pay | Admitting: Family

## 2023-01-02 DIAGNOSIS — R42 Dizziness and giddiness: Secondary | ICD-10-CM

## 2023-01-02 DIAGNOSIS — R55 Syncope and collapse: Secondary | ICD-10-CM

## 2023-01-05 ENCOUNTER — Ambulatory Visit (HOSPITAL_COMMUNITY)
Admission: RE | Admit: 2023-01-05 | Discharge: 2023-01-05 | Disposition: A | Discharge status: Home / Self Care | Payer: Medicare HMO | Source: Ambulatory Visit | Attending: Family | Admitting: Family

## 2023-01-05 DIAGNOSIS — I700 Atherosclerosis of aorta: Secondary | ICD-10-CM | POA: Diagnosis not present

## 2023-01-05 DIAGNOSIS — R911 Solitary pulmonary nodule: Secondary | ICD-10-CM

## 2023-01-05 DIAGNOSIS — I7122 Aneurysm of the aortic arch, without rupture: Secondary | ICD-10-CM | POA: Insufficient documentation

## 2023-01-05 DIAGNOSIS — J398 Other specified diseases of upper respiratory tract: Secondary | ICD-10-CM | POA: Diagnosis not present

## 2023-01-26 ENCOUNTER — Telehealth: Payer: Self-pay | Admitting: Gastroenterology

## 2023-01-26 NOTE — Telephone Encounter (Signed)
Inbound call from patient requesting to speak about endoscopy results from 1/24. Please advise, thank you.

## 2023-01-26 NOTE — Telephone Encounter (Signed)
The pt has been advised that a letter was mailed to him in January with results.  I also read the letter to him and answered all his questions to the best of my ability.  Nothing further needed.

## 2023-01-30 ENCOUNTER — Other Ambulatory Visit: Payer: Medicare HMO

## 2023-02-15 ENCOUNTER — Ambulatory Visit: Payer: Medicare HMO | Admitting: Podiatry

## 2023-02-27 ENCOUNTER — Ambulatory Visit
Admission: RE | Admit: 2023-02-27 | Discharge: 2023-02-27 | Disposition: A | Discharge status: Home / Self Care | Payer: Medicare HMO | Source: Ambulatory Visit | Attending: Family | Admitting: Family

## 2023-02-27 DIAGNOSIS — R55 Syncope and collapse: Secondary | ICD-10-CM

## 2023-02-27 DIAGNOSIS — R42 Dizziness and giddiness: Secondary | ICD-10-CM

## 2023-05-08 ENCOUNTER — Encounter: Payer: Self-pay | Admitting: Gastroenterology

## 2023-06-05 IMAGING — US US ABDOMEN COMPLETE
1 series · 13 of 25 positions shown · non-contrast
Comparison: MRI abdomen May 04, 2020

CLINICAL DATA: Elevated LFTs. History of RCC status post right
nephrectomy.

EXAM:
ABDOMEN ULTRASOUND COMPLETE

[Series 1: us abdomen complete · 0.17mm/px · 13 of 89 slices shown]
[im 1/89]
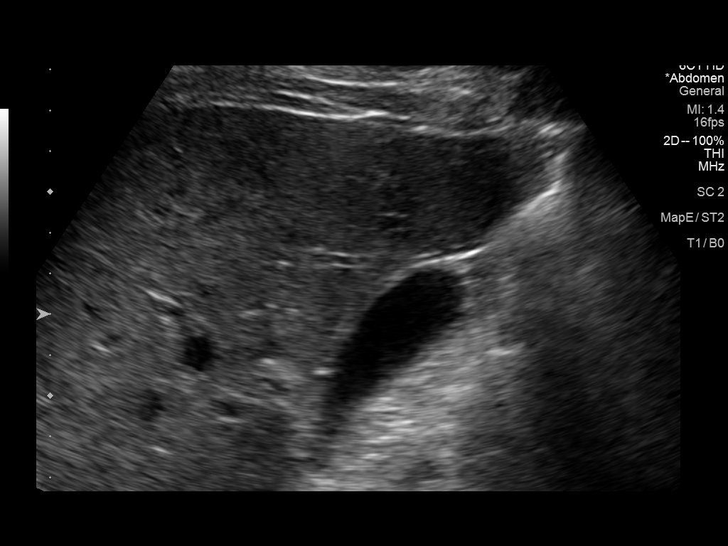
[im 8/89]
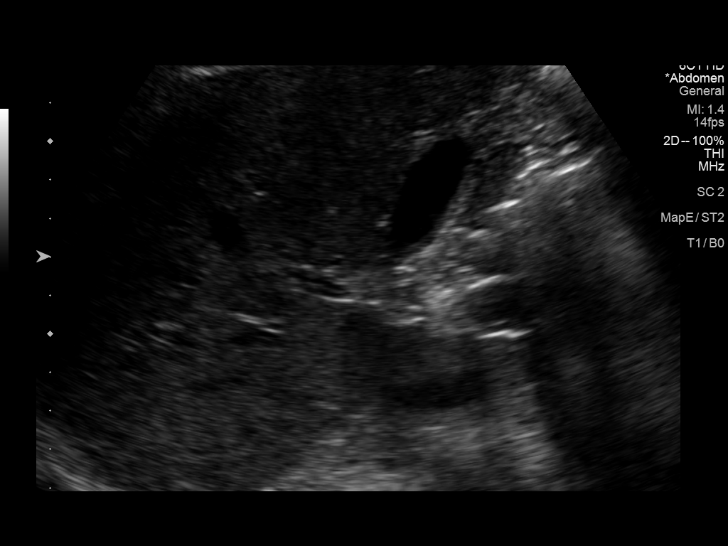
[im 15/89]
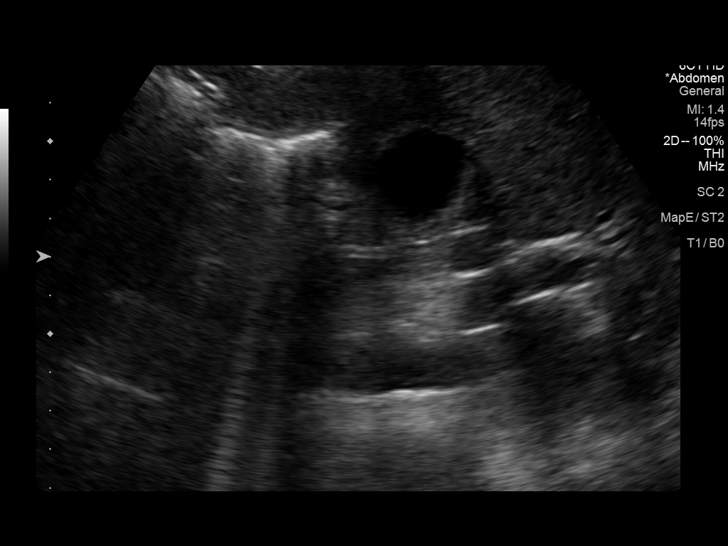
[im 23/89]
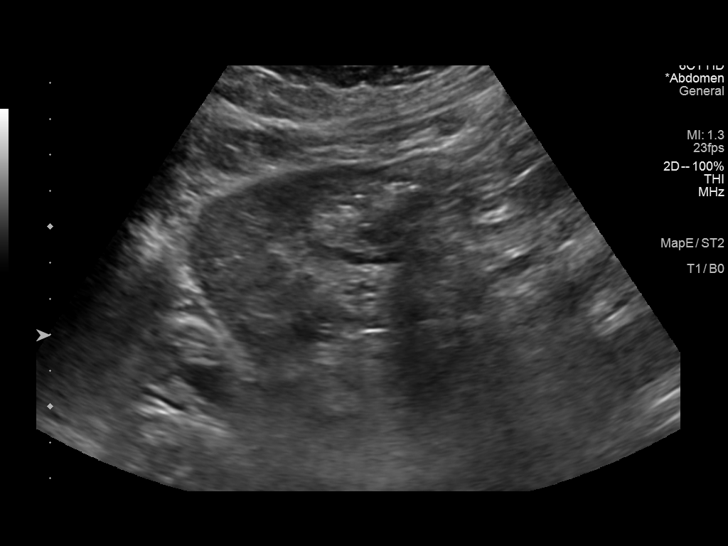
[im 30/89]
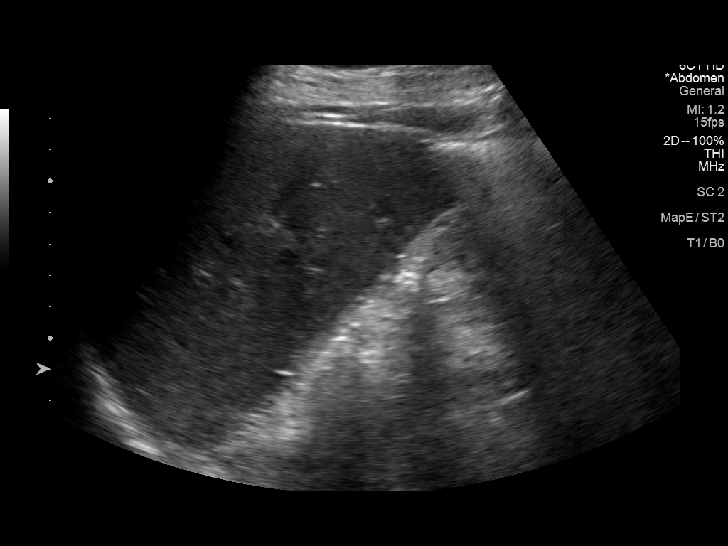
[im 37/89]
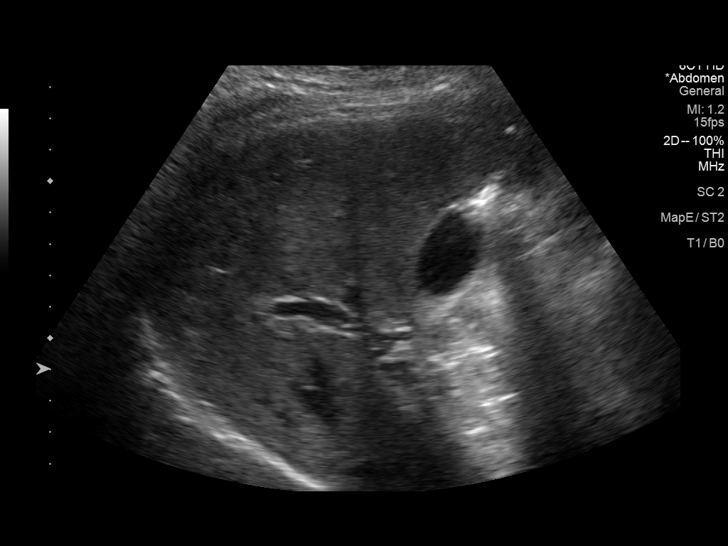
[im 45/89]
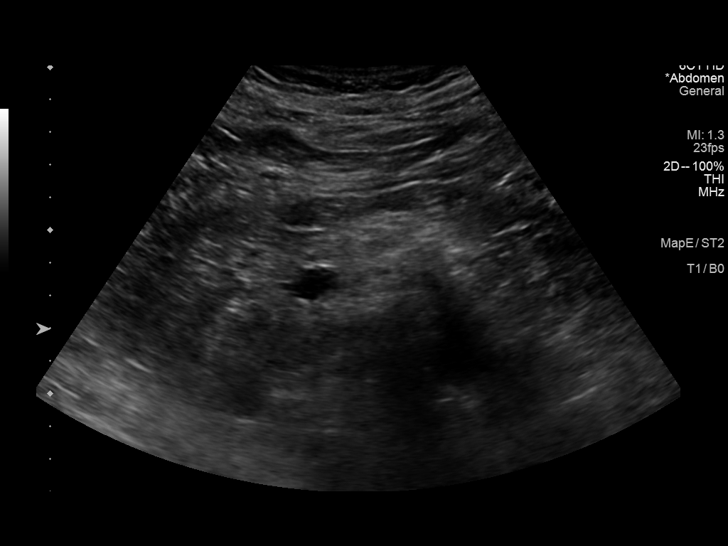
[im 52/89]
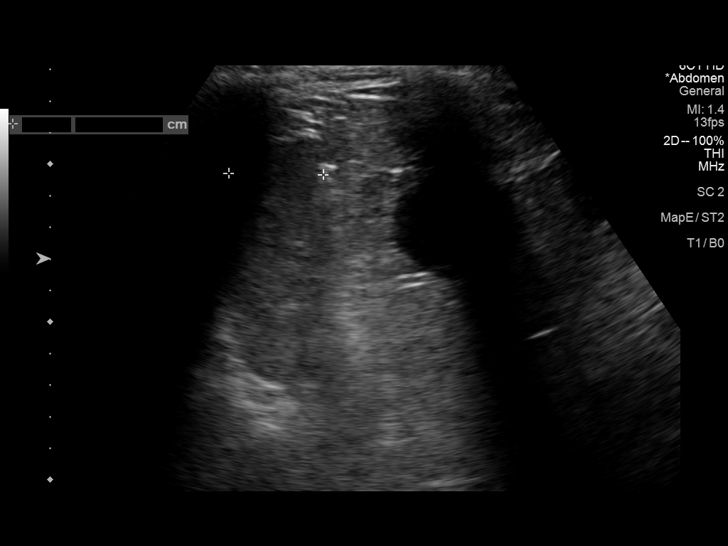
[im 59/89]
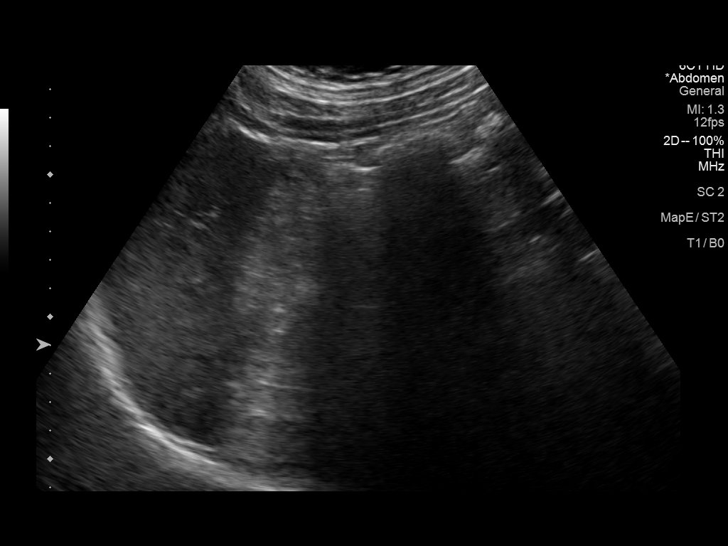
[im 67/89]
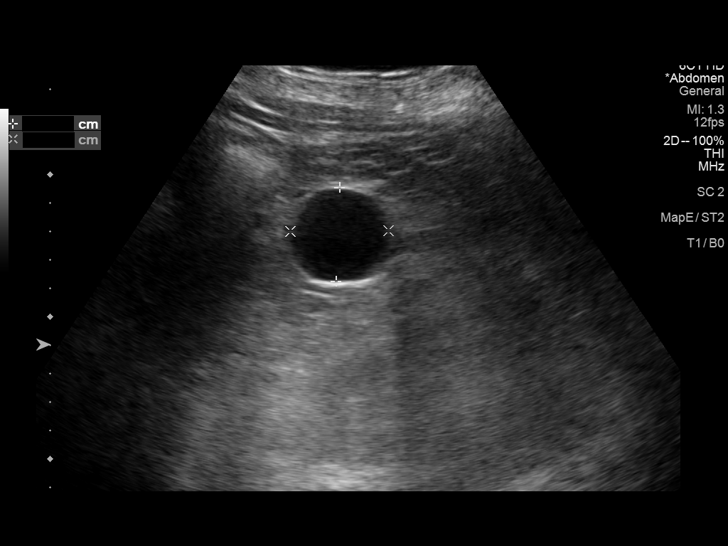
[im 74/89]
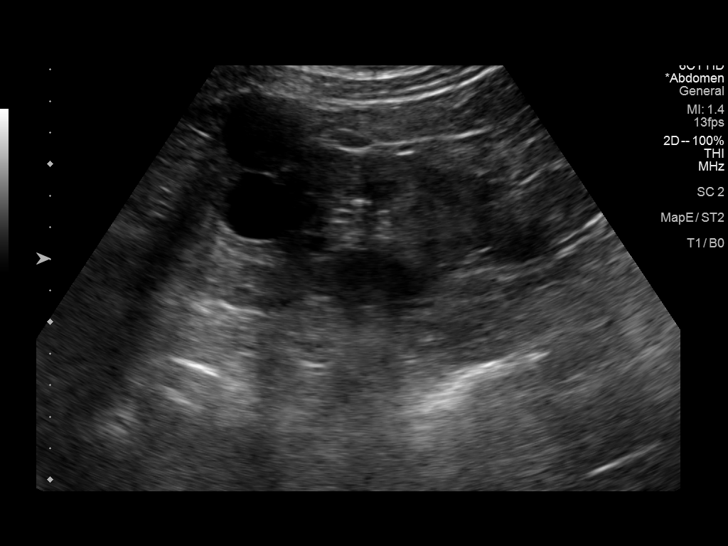
[im 81/89]
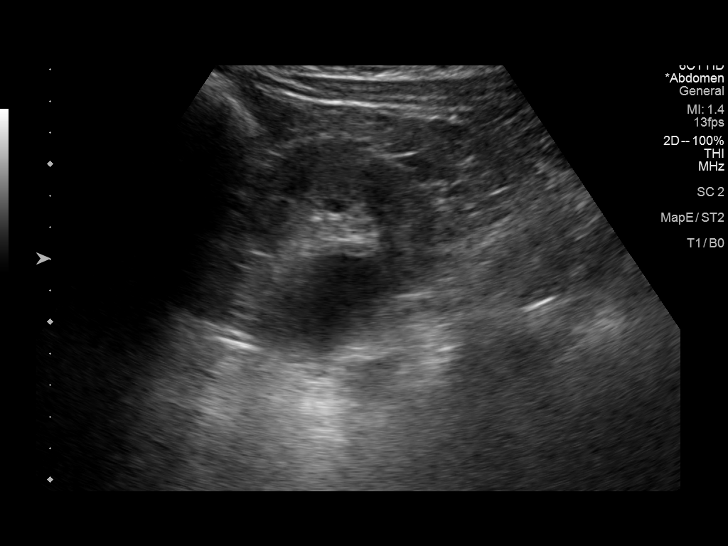
[im 89/89]
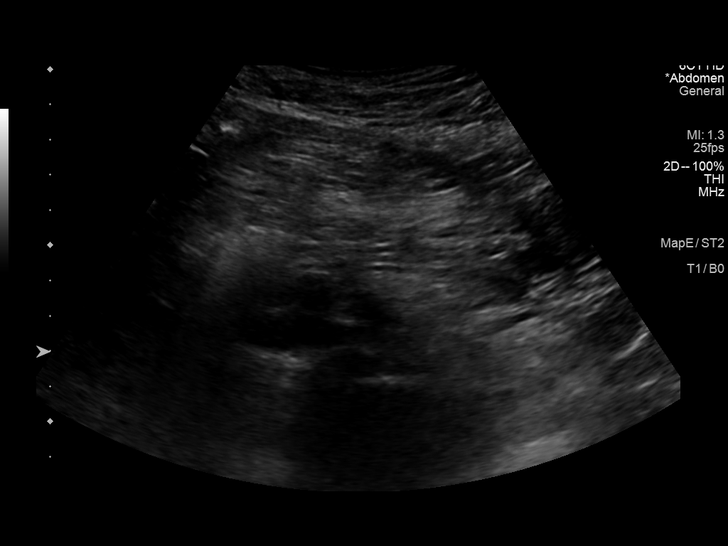

[13 of 25 positions shown; findings below may reference images not displayed]

FINDINGS: Gallbladder: No gallstones or wall thickening visualized. No
sonographic Murphy sign noted by sonographer.

Common bile duct: Diameter: 4 mm

Liver: No focal lesion identified. Slightly coarsened hepatic
echotexture with mild heterogeneously increased echogenicity of the
hepatic parenchyma. Portal vein is patent on color Doppler imaging
with normal direction of blood flow towards the liver.

IVC: No abnormality visualized.

Pancreas: Visualized portion unremarkable.

Spleen: Size and appearance within normal limits.

Right Kidney: Surgically absent

Left Kidney: Length: 12.9 cm. Echogenicity within normal limits. No
hydronephrosis visualized. Multiple renal cysts the largest of which
measures 3.6 cm.

Abdominal aorta: No aneurysm visualized.

Other findings: None.
IMPRESSION: 1. Slightly coarsened hepatic echotexture with mild heterogeneously
increased echogenicity of the hepatic parenchyma, nonspecific and
possibly representing geographic fatty infiltration or hepatitis. If
continued clinical concern these findings could be further
characterized by hepatic protocol MRI with and without contrast.
2. Status post right nephrectomy.

## 2023-09-05 ENCOUNTER — Ambulatory Visit: Payer: Medicare HMO | Admitting: Nurse Practitioner

## 2023-10-12 ENCOUNTER — Other Ambulatory Visit (HOSPITAL_COMMUNITY): Payer: Self-pay | Admitting: *Deleted

## 2023-10-15 ENCOUNTER — Ambulatory Visit (HOSPITAL_COMMUNITY)
Admission: RE | Admit: 2023-10-15 | Discharge: 2023-10-15 | Disposition: A | Discharge status: Home / Self Care | Payer: Medicare HMO | Source: Ambulatory Visit | Attending: Nephrology | Admitting: Nephrology

## 2023-10-15 DIAGNOSIS — N189 Chronic kidney disease, unspecified: Secondary | ICD-10-CM | POA: Diagnosis not present

## 2023-10-15 DIAGNOSIS — D631 Anemia in chronic kidney disease: Secondary | ICD-10-CM | POA: Insufficient documentation

## 2023-10-15 MED ORDER — SODIUM CHLORIDE 0.9 % IV SOLN
250.0000 mg | INTRAVENOUS | Status: DC
  Administered 2023-10-15: 250 mg via INTRAVENOUS
  Filled 2023-10-15: qty 12.5

## 2023-10-18 ENCOUNTER — Other Ambulatory Visit (HOSPITAL_COMMUNITY): Payer: Self-pay | Admitting: *Deleted

## 2023-10-22 ENCOUNTER — Inpatient Hospital Stay (HOSPITAL_COMMUNITY)
Admission: RE | Admit: 2023-10-22 | Discharge: 2023-10-22 | Disposition: A | Discharge status: Home / Self Care | Payer: Medicare HMO | Source: Ambulatory Visit | Attending: Nephrology | Admitting: Nephrology

## 2023-10-26 ENCOUNTER — Ambulatory Visit (HOSPITAL_COMMUNITY)
Admission: RE | Admit: 2023-10-26 | Discharge: 2023-10-26 | Disposition: A | Discharge status: Home / Self Care | Payer: Medicare HMO | Source: Ambulatory Visit | Attending: Nephrology | Admitting: Nephrology

## 2023-10-26 DIAGNOSIS — D631 Anemia in chronic kidney disease: Secondary | ICD-10-CM | POA: Diagnosis present

## 2023-10-26 DIAGNOSIS — N189 Chronic kidney disease, unspecified: Secondary | ICD-10-CM | POA: Insufficient documentation

## 2023-10-26 MED ORDER — SODIUM CHLORIDE 0.9 % IV SOLN
250.0000 mg | INTRAVENOUS | Status: DC
  Administered 2023-10-26: 250 mg via INTRAVENOUS
  Filled 2023-10-26: qty 12.5

## 2023-12-31 ENCOUNTER — Telehealth: Payer: Self-pay | Admitting: Gastroenterology

## 2023-12-31 NOTE — Telephone Encounter (Signed)
 Patient reports he will have a burning sensation in his stomach that he treats most often by eating something. This helps temporarily. He has also gotten temporary relief with Pepto-Bismol. Confirmed he is taking pantoprazole and famotidine. No steroids or NSAIDs. Keep appointment as scheduled for evaluation.

## 2023-12-31 NOTE — Telephone Encounter (Signed)
 Inbound call from patient, states he is experiencing burning in his digestive track. Patient was scheduled for an appointment for 5/12 but would like to know what he can do to help with his symptoms while he waits for his appointment.

## 2024-01-06 NOTE — Progress Notes (Deleted)
 [name] [id number] 26-Aug-1946   Chief Complaint: GERD  Referring Provider: Shannan Dart., FNP Primary GI MD: Dr. Brice Campi (previously seen by Dr. Savannah Curlin)   HPI: [name] is a 78 y.o. male with past medical history of chronic pancreatitis, large gastric polyp and periampullary lesion negative for dysplasia or malignancy removed by Dr. Brice Campi January 2024, GERD, adenomatous colon polyps, prior renal cell cancer s/p right nephrectomy, prior prostate cancer, CKD, T2DM, cholelithiasis who presents today for follow up of GERD.    Patient last seen in office 10/26/2022 for follow-up of multiple GI problems (see past GI history).  At that time he was asymptomatic regarding his chronic pancreatitis, and had completed 2 weeks of Carafate  as recommended by Dr. Brice Campi.  Was not having any significant GERD symptoms or abdominal pain at that time.  Patient called 12/31/2023 reporting a burning sensation in his stomach that improves with eating.  Also reported temporary relief with Pepto-Bismol, and confirmed he is taking pantoprazole and famotidine.  Denied steroid or NSAID use.   (Alarm features - wt loss, dysphagia, odynophagia, IDA, evidence of GI bleeding)   Past GI History: Seen Feb 2023 for a chronically elevated amylase and lipase (he had been seen prior to that in 2021 but was lost to follow-up).  He had an EUS in June 2023 showing chronic pancreatitis.  Etiology unknown.  At the time of EUS a periampullary nodule was found. Biopsies negative for dysplasia or malignancy.  Patient has a history of colon polyps and was due for a surveillance colonoscopy.  For reevaluation of the periampullary nodule he underwent an EGD at the time of surveillance colonoscopy in December 2023.  EGD showed a large sessile polyp with evidence for recent bleeding in the gastric body.  A single medium size carpet like polyp with no bleeding was found in the ampulla.  Biopsies of both lesions  were negative for cancer.  He had 4 colon polyps removed (3 were tubular adenomas).  He subsequently underwent EUS by Dr. Brice Campi with findings as below. The gastric polyp was removed and was hyperplastic.  Periampullary lesion was removed.  Biopsies showed reactive duodenal mucosa with gastric metaplasia compatible with peptic duodenitis. Dr. Brice Campi recommended a follow-up MRI/MRCP in 1 year.   Previous GI Procedures/Imaging   EGD/EUS 09/20/2022 EGD impression:  - No gross lesions in the entire esophagus. Z- line irregular, 39 cm from the incisors.  - 2 cm hiatal hernia.  - Gastric mucosal variant coloration in the antrum noted.  - A single gastric polyp. Resected and retrieved via mucosal resection. Clips ( MR conditional) were placed. Clip manufacturer: [employer].  - Erythematous mucosa in the stomach throughout ( previously biopsied).  - No other gross lesions in the duodenal bulb, in the first portion of the duodenum and in the second portion of the duodenum.  - Normal major papilla with a large intraduodenal portion.  - Mucosal variance in the area just superior and lateral to the major papilla. After EUS completed, this area was biopsied to rule out dysplastic change.  EUS impression:  - Wall thickening was seen in the area of major papilla, within the duodenum. It appeared to primarily be within the deep mucosa ( Layer 2) and did not itself look to be involving the ampulla.  - No evidence of a true intramural ampullary mass was noted on today' s examination.  - Pancreatic parenchymal abnormalities consisting of lobularity with honeycombing and hyperechoic strands were noted  in the entire pancreas. The patient' s pancreas suggests evidence of chronic pancreatitis.  - The pancreatic duct in the pancreatic head and genu were dilated but eventually tapered normally towards the distal neck and body and tail of the pancreas.  - There was prominence of the common bile duct but no  evidence of micro choledocholithiasis.  - No malignant-appearing lymph nodes were visualized in the celiac region ( level 20) , peripancreatic region and porta hepatis region.  A. STOMACH, POLYPECTOMY:  Hyperplastic polyp with surface erosion and chronic active gastritis  Helicobacter stain negative (IHC, adequate control)  Negative for intestinal metaplasia, dysplasia and carcinoma   B. PERIAMPULAR LESIONS, BIOPSY:  Reactive duodenal mucosa with gastric metaplasia compatible with peptic  duodenitis  Negative for dysplasia and carcinoma   MRCP 09/11/2022 1. Status post right nephrectomy without evidence of local recurrence or metastatic disease in the abdomen. 2. Similar prominence of the common bile duct and pancreatic duct with smooth tapering to the ampulla without filling defect or focal mass lesions. 3. Cholelithiasis without acute cholecystitis.  Colonoscopy 08/03/2022 - Diverticulosis in the sigmoid colon and in the descending colon.  - Three 2 to 5 mm polyps in the transverse colon, removed with a cold snare. Resected and retrieved.  - One 2 mm polyp in the ascending colon, removed with a cold snare. Resected and retrieved.  - The examination was otherwise normal on direct and retroflexion views. - No recall due to age Path: 1. Surgical [P], perampullary nodule - BENIGN POLYPOID DUODENAL MUCOSA WITH NO SPECIFIC HISTOPATHOLOGIC CHANGES - NEGATIVE FOR DYSPLASIA OR CARCINOMA 2. Surgical [P], gastric polyp, polyp (1) - GASTRIC HYPERPLASTIC POLYP WITH ULCERATION - NEGATIVE FOR INTESTINAL METAPLASIA OR DYSPLASIA 3. Surgical [P], colon, ascending, polyp (1) - TUBULAR ADENOMA WITHOUT HIGH-GRADE DYSPLASIA OR MALIGNANCY 4. Surgical [P], colon, tranverse, polyp (3) - TUBULAR ADENOMA(S) WITHOUT HIGH-GRADE DYSPLASIA OR MALIGNANCY - HYPERPLASTIC POLYP  Past Medical History:  Diagnosis Date   Allergy    POLLEN   Arthritis    LEFT HIP,HAD SHOTS,BACK   Cancer (HCC)    prostate  /renal ca   Cataract    BILATERAL   CKD (chronic kidney disease)    GERD (gastroesophageal reflux disease)    Glaucoma    Hyperlipidemia    Hypertension    Neuromuscular disorder (HCC)    RIGHT FOOT   Renal insufficiency    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   T2DM (type 2 diabetes mellitus) (HCC)     Past Surgical History:  Procedure Laterality Date   BIOPSY  01/26/2022   Procedure: BIOPSY;  Surgeon: Janel Medford, MD;  Location: [facility];  Service: Gastroenterology;;   COLONOSCOPY  04/17/12   Poor prep   COLONOSCOPY  12/20/12   Repeat, 1 Polyp - needs 93yr f/u   ENDOSCOPIC MUCOSAL RESECTION  09/20/2022   Procedure: ENDOSCOPIC MUCOSAL RESECTION;  Surgeon: Normie Becton., MD;  Location: [facility];  Service: Gastroenterology;;   ESOPHAGOGASTRODUODENOSCOPY (EGD) WITH PROPOFOL  N/A 01/26/2022   Procedure: ESOPHAGOGASTRODUODENOSCOPY (EGD) WITH PROPOFOL ;  Surgeon: Janel Medford, MD;  Location: [facility];  Service: Gastroenterology;  Laterality: N/A;   ESOPHAGOGASTRODUODENOSCOPY (EGD) WITH PROPOFOL  N/A 09/20/2022   Procedure: ESOPHAGOGASTRODUODENOSCOPY (EGD) WITH PROPOFOL ;  Surgeon: Brice Campi Albino Alu., MD;  Location: [facility];  Service: Gastroenterology;  Laterality: N/A;   EUS N/A 01/26/2022   Procedure: UPPER ENDOSCOPIC ULTRASOUND (EUS) RADIAL;  Surgeon: Janel Medford, MD;  Location: [facility];  Service: Gastroenterology;  Laterality:  N/A;   EUS N/A 09/20/2022   Procedure: UPPER ENDOSCOPIC ULTRASOUND (EUS) RADIAL;  Surgeon: Normie Becton., MD;  Location: [facility];  Service: Gastroenterology;  Laterality: N/A;   FOOT SURGERY     HEMOSTASIS CLIP PLACEMENT  09/20/2022   Procedure: HEMOSTASIS CLIP PLACEMENT;  Surgeon: Brice Campi Albino Alu., MD;  Location: [facility];  Service: Gastroenterology;;   NEPHRECTOMY     SUBMUCOSAL LIFTING INJECTION  09/20/2022   Procedure: SUBMUCOSAL LIFTING INJECTION;  Surgeon: Normie Becton., MD;   Location: [facility];  Service: Gastroenterology;;    Current Outpatient Medications  Medication Sig Dispense Refill   acetaminophen (TYLENOL) 650 MG CR tablet Take 650 mg by mouth daily.     atorvastatin (LIPITOR) 40 MG tablet Take 40 mg by mouth at bedtime.     famotidine (PEPCID) 20 MG tablet Take 20 mg by mouth 2 (two) times daily.     furosemide (LASIX) 40 MG tablet Take 40 mg by mouth every other day.     hydrALAZINE (APRESOLINE) 100 MG tablet Take 100 mg by mouth 2 (two) times daily.     isosorbide mononitrate (IMDUR) 30 MG 24 hr tablet Take 30 mg by mouth daily.     Naphazoline HCl (CLEAR EYES OP) Place 1 drop into both eyes daily as needed (burning).     OVER THE COUNTER MEDICATION Take 2 tablets by mouth daily. NeuroQ     pantoprazole (PROTONIX) 40 MG tablet Take 40 mg by mouth 2 (two) times daily.     primidone (MYSOLINE) 50 MG tablet Take 200 mg by mouth at bedtime.     sennosides-docusate sodium (SENOKOT-S) 8.6-50 MG tablet Take 2 tablets by mouth daily as needed for constipation.     Current Facility-Administered Medications  Medication Dose Route Frequency Provider Last Rate Last Admin   0.9 %  sodium chloride  infusion  500 mL Intravenous Once Lindle Rhea, MD        Allergies as of 01/07/2024   (No Known Allergies)    Family History  Problem Relation Age of Onset   Asthma Mother    Cancer Maternal Grandmother    Cancer Maternal Grandfather    Colon cancer Neg Hx    Stomach cancer Neg Hx    Esophageal cancer Neg Hx    Colon polyps Neg Hx    Crohn's disease Neg Hx    Ulcerative colitis Neg Hx    Rectal cancer Neg Hx     Social History   Tobacco Use   Smoking status: Never    Passive exposure: Never   Smokeless tobacco: Never  Vaping Use   Vaping status: Never Used  Substance Use Topics   Alcohol use: Never   Drug use: Never     Review of Systems:    Constitutional: No weight loss, fever, chills, weakness or fatigue HEENT: Eyes: No change in  vision Ears, Nose, Throat:  No change in hearing or congestion Skin: No rash or itching Cardiovascular: No chest pain, chest pressure or palpitations   Respiratory: No SOB or cough Gastrointestinal: See HPI and otherwise negative Genitourinary: No dysuria or change in urinary frequency Neurological: No headache, dizziness or syncope Musculoskeletal: No new muscle or joint pain Hematologic: No bleeding or bruising    Physical Exam:  Vital signs: There were no vitals taken for this visit.  Constitutional: NAD, Well developed, Well nourished, alert and cooperative Head:  Normocephalic and atraumatic. Mouth: No oral lesions. Eyes:  No scleral icterus. Conjunctiva pink. Respiratory: Respirations  even and unlabored. Lungs clear to auscultation bilaterally.  No wheezes, crackles, or rhonchi.  Cardiovascular:  Regular rate and rhythm. No peripheral edema. Gastrointestinal:  Soft, nondistended, nontender. No rebound or guarding. Normal bowel sounds. No appreciable masses or hepatomegaly. Rectal:  Not performed.  Neurologic:  Alert and oriented x4;  grossly normal neurologically.  Skin:   Dry and intact without significant lesions or rashes. Psychiatric: Oriented to person, place and time. Demonstrates good judgement and reason without abnormal affect or behaviors.   RELEVANT LABS AND IMAGING: CBC    Component Value Date/Time   WBC 6.0 04/23/2022 0018   RBC 2.79 (L) 04/23/2022 0018   HGB 9.3 (L) 04/23/2022 0018   HCT 27.5 (L) 04/23/2022 0018   PLT 177 04/23/2022 0018   MCV 98.6 04/23/2022 0018   MCH 33.3 04/23/2022 0018   MCHC 33.8 04/23/2022 0018   RDW 13.7 04/23/2022 0018   LYMPHSABS 1.3 09/08/2019 1138   MONOABS 0.4 09/08/2019 1138   EOSABS 0.2 09/08/2019 1138   BASOSABS 0.1 09/08/2019 1138    CMP     Component Value Date/Time   NA 135 04/23/2022 0018   K 4.9 04/23/2022 0018   CL 111 04/23/2022 0018   CO2 15 (L) 04/23/2022 0018   GLUCOSE 95 04/23/2022 0018   BUN 66  (H) 04/23/2022 0018   CREATININE 4.17 (H) 04/23/2022 0018   CREATININE 1.90 (H) 12/07/2011 1511   CALCIUM 8.8 (L) 04/23/2022 0018   PROT 7.4 09/08/2019 1138   ALBUMIN 4.2 09/08/2019 1138   AST 18 09/08/2019 1138   ALT 22 09/08/2019 1138   ALKPHOS 69 09/08/2019 1138   BILITOT 0.5 09/08/2019 1138   GFRNONAA 14 (L) 04/23/2022 0018   GFRAA (L) 01/16/2008 0515    48        The eGFR has been calculated using the MDRD equation. This calculation has not been validated in all clinical     Assessment/Plan:   Optimize GERD tx - PPI BID Consider 2 week course of carafate  EGD if alarm symptoms Check a CBC, CMP if no recent labs available, evidence of blood loss, etc. Consider cholecystitis Needs follow up MRI/MRCP   Valiant Gaul, PA-C [facility] 01/06/2024, 4:30 PM  Patient Care Team: Shannan Dart., FNP as PCP - General (Family Medicine) Sheryle Donning, MD as PCP - Cardiology (Cardiology)

## 2024-01-07 ENCOUNTER — Ambulatory Visit: Admitting: Gastroenterology

## 2024-01-12 ENCOUNTER — Encounter (HOSPITAL_COMMUNITY): Payer: Self-pay

## 2024-01-12 ENCOUNTER — Emergency Department (HOSPITAL_COMMUNITY)
Admission: EM | Admit: 2024-01-12 | Discharge: 2024-01-12 | Disposition: A | Discharge status: Home / Self Care | Attending: Emergency Medicine | Admitting: Emergency Medicine

## 2024-01-12 ENCOUNTER — Emergency Department (HOSPITAL_COMMUNITY)

## 2024-01-12 ENCOUNTER — Other Ambulatory Visit: Payer: Self-pay

## 2024-01-12 DIAGNOSIS — R600 Localized edema: Secondary | ICD-10-CM | POA: Diagnosis not present

## 2024-01-12 DIAGNOSIS — D72829 Elevated white blood cell count, unspecified: Secondary | ICD-10-CM | POA: Insufficient documentation

## 2024-01-12 DIAGNOSIS — Z79899 Other long term (current) drug therapy: Secondary | ICD-10-CM | POA: Insufficient documentation

## 2024-01-12 DIAGNOSIS — N189 Chronic kidney disease, unspecified: Secondary | ICD-10-CM | POA: Diagnosis not present

## 2024-01-12 DIAGNOSIS — I129 Hypertensive chronic kidney disease with stage 1 through stage 4 chronic kidney disease, or unspecified chronic kidney disease: Secondary | ICD-10-CM | POA: Diagnosis not present

## 2024-01-12 DIAGNOSIS — D649 Anemia, unspecified: Secondary | ICD-10-CM | POA: Insufficient documentation

## 2024-01-12 DIAGNOSIS — R062 Wheezing: Secondary | ICD-10-CM | POA: Diagnosis not present

## 2024-01-12 DIAGNOSIS — R059 Cough, unspecified: Secondary | ICD-10-CM | POA: Insufficient documentation

## 2024-01-12 DIAGNOSIS — J40 Bronchitis, not specified as acute or chronic: Secondary | ICD-10-CM

## 2024-01-12 LAB — COMPREHENSIVE METABOLIC PANEL WITH GFR
ALT: 34 U/L (ref 0–44)
AST: 29 U/L (ref 15–41)
Albumin: 4.4 g/dL (ref 3.5–5.0)
Alkaline Phosphatase: 64 U/L (ref 38–126)
Anion gap: 11 (ref 5–15)
BUN: 60 mg/dL — ABNORMAL HIGH (ref 8–23)
CO2: 16 mmol/L — ABNORMAL LOW (ref 22–32)
Calcium: 8.7 mg/dL — ABNORMAL LOW (ref 8.9–10.3)
Chloride: 107 mmol/L (ref 98–111)
Creatinine, Ser: 4.41 mg/dL — ABNORMAL HIGH (ref 0.61–1.24)
GFR, Estimated: 13 mL/min — ABNORMAL LOW (ref >60)
Glucose, Bld: 83 mg/dL (ref 70–99)
Potassium: 5 mmol/L (ref 3.5–5.1)
Sodium: 134 mmol/L — ABNORMAL LOW (ref 135–145)
Total Bilirubin: 0.9 mg/dL (ref 0.0–1.2)
Total Protein: 7.6 g/dL (ref 6.5–8.1)

## 2024-01-12 LAB — CBC WITH DIFFERENTIAL/PLATELET
Abs Immature Granulocytes: 0.02 10*3/uL (ref 0.00–0.07)
Basophils Absolute: 0 10*3/uL (ref 0.0–0.1)
Basophils Relative: 0 %
Eosinophils Absolute: 0.2 10*3/uL (ref 0.0–0.5)
Eosinophils Relative: 3 %
HCT: 30.3 % — ABNORMAL LOW (ref 39.0–52.0)
Hemoglobin: 9.7 g/dL — ABNORMAL LOW (ref 13.0–17.0)
Immature Granulocytes: 0 %
Lymphocytes Relative: 28 %
Lymphs Abs: 2 10*3/uL (ref 0.7–4.0)
MCH: 33.1 pg (ref 26.0–34.0)
MCHC: 32 g/dL (ref 30.0–36.0)
MCV: 103.4 fL — ABNORMAL HIGH (ref 80.0–100.0)
Monocytes Absolute: 0.9 10*3/uL (ref 0.1–1.0)
Monocytes Relative: 12 %
Neutro Abs: 4.1 10*3/uL (ref 1.7–7.7)
Neutrophils Relative %: 57 %
Platelets: 162 10*3/uL (ref 150–400)
RBC: 2.93 MIL/uL — ABNORMAL LOW (ref 4.22–5.81)
RDW: 13.4 % (ref 11.5–15.5)
WBC: 7.2 10*3/uL (ref 4.0–10.5)
nRBC: 0 % (ref 0.0–0.2)

## 2024-01-12 LAB — RESP PANEL BY RT-PCR (RSV, FLU A&B, COVID)  RVPGX2
Influenza A by PCR: NEGATIVE
Influenza B by PCR: NEGATIVE
Resp Syncytial Virus by PCR: NEGATIVE
SARS Coronavirus 2 by RT PCR: NEGATIVE

## 2024-01-12 LAB — TROPONIN I (HIGH SENSITIVITY): Troponin I (High Sensitivity): 5 ng/L (ref <18)

## 2024-01-12 MED ORDER — IPRATROPIUM-ALBUTEROL 0.5-2.5 (3) MG/3ML IN SOLN
3.0000 mL | Freq: Once | RESPIRATORY_TRACT | Status: AC
  Administered 2024-01-12: 3 mL via RESPIRATORY_TRACT
  Filled 2024-01-12: qty 3

## 2024-01-12 MED ORDER — PREDNISONE 20 MG PO TABS: 40.0000 mg | ORAL_TABLET | Freq: Every day | ORAL | 0 refills | Status: AC

## 2024-01-12 MED ORDER — ALBUTEROL SULFATE (2.5 MG/3ML) 0.083% IN NEBU
10.0000 mg | INHALATION_SOLUTION | Freq: Once | RESPIRATORY_TRACT | Status: AC
  Administered 2024-01-12: 10 mg via RESPIRATORY_TRACT
  Filled 2024-01-12: qty 12

## 2024-01-12 MED ORDER — ALBUTEROL SULFATE HFA 108 (90 BASE) MCG/ACT IN AERS: 1.0000 | INHALATION_SPRAY | Freq: Four times a day (QID) | RESPIRATORY_TRACT | 0 refills | Status: AC | PRN

## 2024-01-12 MED ORDER — MAGNESIUM SULFATE 2 GM/50ML IV SOLN
2.0000 g | Freq: Once | INTRAVENOUS | Status: AC
  Administered 2024-01-12: 2 g via INTRAVENOUS
  Filled 2024-01-12: qty 50

## 2024-01-12 MED ORDER — METHYLPREDNISOLONE SODIUM SUCC 125 MG IJ SOLR
125.0000 mg | Freq: Once | INTRAMUSCULAR | Status: AC
  Administered 2024-01-12: 125 mg via INTRAVENOUS
  Filled 2024-01-12: qty 2

## 2024-01-12 MED ORDER — AZITHROMYCIN 250 MG PO TABS: 250.0000 mg | ORAL_TABLET | Freq: Every day | ORAL | 0 refills | Status: DC

## 2024-01-12 NOTE — ED Triage Notes (Signed)
 Pt arrived via POV for possible upper respiratory infection, pt reports Productive cough with green phlem and white thick nasal discharge onset 1 week ago. Pt stated around xmas he had same systems and his pcp prescribed z-pack and steroids.

## 2024-01-12 NOTE — Discharge Instructions (Addendum)
 You were seen in the ER today for evaluation of your cough and cold symptoms. I think your likely have bronchitis which is causing your wheezing and cough.  For this, I have prescribed you medications to take.  One is called prednisone which will take daily for the next 5 days which help with the inflammation.  I will prescribe you an antibiotic as well in case this is some hidden pneumonia.  I have also refilled your albuterol inhaler for use as needed.  This will help with your cough and wheezing as well.  I would like for you to follow with your primary care doctor within the next few days.  If you start to have any chest pain, worsening shortness of breath, fever, please return to your nearest Emergency Department for reevaluation.  If you have any other concerns, new or worsening symptoms, please return to your nearest emergency department for reevaluation.

## 2024-01-12 NOTE — ED Notes (Signed)
 Pt walked in the ed with writer, pt O2 stayed between 95-98%. Pt had no complaints MD was notified

## 2024-01-12 NOTE — ED Provider Notes (Signed)
 [place] [facility] AT [facility] Provider Note   CSN: [id number] Arrival date & time: 01/12/24  1452     History Chief Complaint  Patient presents with   Cough   Nasal Congestion    [name] is a 78 y.o. male with history of hypertension, GERD, tremor, CKD presents emerged from today for evaluation of productive cough for the past 3 days.  Reports he is having green productive cough, no hemoptysis.  Also ports he was having some thick white mucus with rhinorrhea and nasal congestion.  Reports some shortness of breath and feels like he may be wheezing.  He denies any chest pain or palpitations.  Reports that his leg edema is at his baseline and is unchanged for the past few years.  He denies any body aches, sore throat, abdominal pain, nausea, vomiting.  Denies any known sick contacts.  No known drug allergies.  Denies any tobacco, EtOH, drug use.  He has not tried any medications for this previously.  He reports this feels over the last time he had pneumonia/bronchitis.   Cough Associated symptoms: rhinorrhea and shortness of breath   Associated symptoms: no chest pain, no chills, no ear pain, no fever, no myalgias and no sore throat        Home Medications Prior to Admission medications   Medication Sig Start Date End Date Taking? Authorizing Provider  acetaminophen (TYLENOL) 650 MG CR tablet Take 650 mg by mouth daily.    [provider]  atorvastatin (LIPITOR) 40 MG tablet Take 40 mg by mouth at bedtime.    [provider]  famotidine (PEPCID) 20 MG tablet Take 20 mg by mouth 2 (two) times daily.    [provider]  furosemide (LASIX) 40 MG tablet Take 40 mg by mouth every other day.    [provider]  hydrALAZINE (APRESOLINE) 100 MG tablet Take 100 mg by mouth 2 (two) times daily.    [provider]  isosorbide mononitrate (IMDUR) 30 MG 24 hr tablet Take 30 mg by mouth daily.    [provider]  Naphazoline HCl (CLEAR EYES OP) Place 1 drop into both eyes daily as needed (burning).    [provider]  OVER THE COUNTER MEDICATION Take 2 tablets by mouth daily. NeuroQ    [provider]  pantoprazole (PROTONIX) 40 MG tablet Take 40 mg by mouth 2 (two) times daily.    [provider]  primidone (MYSOLINE) 50 MG tablet Take 200 mg by mouth at bedtime. 09/01/21   [provider]  sennosides-docusate sodium (SENOKOT-S) 8.6-50 MG tablet Take 2 tablets by mouth daily as needed for constipation.    [provider]      Allergies    Patient has no known allergies.    Review of Systems   Review of Systems  Constitutional:  Negative for chills and fever.  HENT:  Positive for congestion and rhinorrhea. Negative for ear pain and sore throat.   Respiratory:  Positive for cough and shortness of breath. Negative for chest tightness.   Cardiovascular:  Negative for chest pain and palpitations.  Gastrointestinal:  Negative for abdominal pain, nausea and vomiting.  Musculoskeletal:  Negative for myalgias.    Physical Exam Updated Vital Signs BP (!) 176/89   Pulse 74   Temp 98.5 F (36.9 C)   Resp 18   Ht 6' (1.829 m)   Wt 95.3 kg   SpO2 100%   BMI 28.48  kg/m  Physical Exam Vitals and nursing note reviewed.  Constitutional:      General: He is not in acute distress.    Appearance: He is not ill-appearing or toxic-appearing.  HENT:     Nose: Congestion present.     Mouth/Throat:     Mouth: Mucous membranes are moist.  Eyes:     General: No scleral icterus. Cardiovascular:     Rate and Rhythm: Normal rate.  Pulmonary:     Effort: Pulmonary effort is normal. No respiratory distress.     Breath sounds: Wheezing present.  Musculoskeletal:     Right lower leg: Edema present.     Left lower leg: Edema present.     Comments: LLE edema 1+ RLE 3+, patient reports it has been chronic  Skin:    General: Skin is warm and dry.  Neurological:      Mental Status: He is alert.     ED Results / Procedures / Treatments   Labs (all labs ordered are listed, but only abnormal results are displayed) Labs Reviewed  CBC WITH DIFFERENTIAL/PLATELET - Abnormal; Notable for the following components:      Result Value   RBC 2.93 (*)    Hemoglobin 9.7 (*)    HCT 30.3 (*)    MCV 103.4 (*)    All other components within normal limits  COMPREHENSIVE METABOLIC PANEL WITH GFR - Abnormal; Notable for the following components:   Sodium 134 (*)    CO2 16 (*)    BUN 60 (*)    Creatinine, Ser 4.41 (*)    Calcium 8.7 (*)    GFR, Estimated 13 (*)    All other components within normal limits  RESP PANEL BY RT-PCR (RSV, FLU A&B, COVID)  [id number]  TROPONIN I (HIGH SENSITIVITY)  TROPONIN I (HIGH SENSITIVITY)    EKG EKG Interpretation Date/Time:  Saturday Jan 12 2024 18:38:42 EDT Ventricular Rate:  75 PR Interval:  59 QRS Duration:  98 QT Interval:  402 QTC Calculation: 449 R Axis:   -28  Text Interpretation: Sinus rhythm Short PR interval Borderline left axis deviation Nonspecific T abnormalities, lateral leads Confirmed by Dorenda Gandy [phone]) on 01/12/2024 8:13:37 PM  Radiology DG Chest 2 View Result Date: 01/12/2024 CLINICAL DATA:  Cough, wheezing EXAM: CHEST - 2 VIEW COMPARISON:  April 23, 2022 FINDINGS: The cardiomediastinal silhouette is unchanged in contour.Atherosclerotic calcifications. Tortuous thoracic aorta. No pleural effusion. No pneumothorax. No acute pleuroparenchymal abnormality. Visualized abdomen is unremarkable. Multilevel degenerative changes of the thoracic spine. IMPRESSION: No acute cardiopulmonary abnormality. Electronically Signed   By: Clancy Crimes M.D.   On: 01/12/2024 17:16    Procedures Procedures   Medications Ordered in ED Medications  methylPREDNISolone sodium succinate (SOLU-MEDROL) 125 mg/2 mL injection 125 mg (125 mg Intravenous Given 01/12/24 1749)  ipratropium-albuterol (DUONEB) 0.5-2.5 (3)  MG/3ML nebulizer solution 3 mL (3 mLs Nebulization Given 01/12/24 1751)  ipratropium-albuterol (DUONEB) 0.5-2.5 (3) MG/3ML nebulizer solution 3 mL (3 mLs Nebulization Given 01/12/24 1944)    ED Course/ Medical Decision Making/ A&P                                Medical Decision Making Amount and/or Complexity of Data Reviewed Labs: ordered. Radiology: ordered.  Risk Prescription drug management.   78 y.o. male presents to the ER for evaluation of cough and cold symptoms with SOB. Differential diagnosis includes but is not limited to viral illness,  COVID, flu, RSV, bronchitis, PNA. Vital signs elevated BP, otherwise unremarkable. Physical exam as noted above.   On previous chart evaluation, the patient's edema does appear chronic. He report it was from a previous injury and has not had any changes to it. He has been seen for bronchitis previously in Dec 2024. Will obtain labs, viral panel, CXR, and EKG. I think this is likely SOB from his wheezing. With his other cough and cold symptoms, I think this is less likely a PE.   I independently reviewed and interpreted the patient's labs.  CMP shows sodium of 134, bicarb of 16, BUN of 60 with a creatinine of 4.41.  Calcium 8.70 with a GFR of 13.  No other electrolyte or LFT abnormality.  Creatinine at baseline.  CBC does show hemoglobin 9.7, anemia baseline.  Leukocytosis.  Negative for COVID, flu, RSV.  Troponin at 5, no need for repeat given duration.  Chest x-ray shows No acute cardiopulmonary abnormality. Per radiologist's interpretation.    EKG reviewed and interpreted by my attending and read as sinus rhythm Short PR interval Borderline left axis deviation Nonspecific T abnormalities, lateral leads.  The patient was given two duonebs and solumedrol. On re-evaluation, the patient reports he does feel better. He still has coarse rhonchi in the bilateral lower bases still. However, he does appear to be moving air better. Will order magnesium and  continuous neb and re-evaluate.   9:19 PM Care of [facility] transferred to [employer] [facility] at the end of my shift as the patient will require reassessment once labs/imaging have resulted. Patient presentation, ED course, and plan of care discussed with review of all pertinent labs and imaging. Please see his/her note for further details regarding further ED course and disposition. Plan at time of handoff is re-evaluate wheezing, ambulate patient with pulse ox. If improved, can be discharged. This may be altered or completely changed at the discretion of the oncoming team pending results of further workup.  Portions of this report may have been transcribed using voice recognition software. Every effort was made to ensure accuracy; however, inadvertent computerized transcription errors may be present.    Final Clinical Impression(s) / ED Diagnoses Final diagnoses:  None    Rx / DC Orders ED Discharge Orders     None         Spence Dux, Kirby Peoples 01/12/24 2121    Dorenda Gandy, MD 01/12/24 2148

## 2024-02-07 NOTE — Progress Notes (Deleted)
 [name] [id number] 1946/02/05   Chief Complaint: GERD  Referring Provider: Shannan Dart., FNP Primary GI MD: Dr. Brice Campi  HPI: [name] is a 78 y.o. male with past medical history of GERD, T2DM, HTN, HLD, CKD, renal cancer s/p right nephrectomy, chronic pancreatitis of unclear etiology, cholelithiasis, large gastric polyp and periampullary lesions (removed by Dr. Brice Campi 08/2022), chronic active gastritis on biopsies 08/2022, adenomatous colon polyps 07/2022 who presents today for a complaint of GERD.    Last seen in office 10/26/2022 by Mai Schwalbe, NP for procedure follow-up.  Plan was for MRI/MRCP in one year to follow pancreas duct dilatation.   Previous GI Procedures/Imaging   Upper EUS 09/20/2022 EGD impression:  - No gross lesions in the entire esophagus. Z- line irregular, 39 cm from the incisors.  - 2 cm hiatal hernia.  - Gastric mucosal variant coloration in the antrum noted.  - A single gastric polyp. Resected and retrieved via mucosal resection. Clips ( MR conditional) were placed. Clip manufacturer: [employer].  - Erythematous mucosa in the stomach throughout ( previously biopsied) . - No other gross lesions in the duodenal bulb, in the first portion of the duodenum and in the second portion of the duodenum.  - Normal major papilla with a large intraduodenal portion.  - Mucosal variance in the area just superior and lateral to the major papilla. After EUS completed, this area was biopsied to rule out dysplastic change.  EUS impression:  - Wall thickening was seen in the area of major papilla, within the duodenum. It appeared to primarily be within the deep mucosa ( Layer 2) and did not itself look to be involving the ampulla.  - No evidence of a true intramural ampullary mass was noted on today' s examination.  - Pancreatic parenchymal abnormalities consisting of lobularity with honeycombing and hyperechoic strands were noted in the  entire pancreas. The patient' s pancreas suggests evidence of chronic pancreatitis.  - The pancreatic duct in the pancreatic head and genu were dilated but eventually tapered normally towards the distal neck and body and tail of the pancreas.  - There was prominence of the common bile duct but no evidence of micro choledocholithiasis.  - No malignant- appearing lymph nodes were visualized in the celiac region ( level 20) , peripancreatic region and porta hepatis region. Path: A. STOMACH, POLYPECTOMY:  Hyperplastic polyp with surface erosion and chronic active gastritis  Helicobacter stain negative (IHC, adequate control)  Negative for intestinal metaplasia, dysplasia and carcinoma   B. PERIAMPULAR LESIONS, BIOPSY:  Reactive duodenal mucosa with gastric metaplasia compatible with peptic  duodenitis  Negative for dysplasia and carcinoma   MRI/MRCP 09/11/2022 1. Status post right nephrectomy without evidence of local recurrence or metastatic disease in the abdomen. 2. Similar prominence of the common bile duct and pancreatic duct with smooth tapering to the ampulla without filling defect or focal mass lesions. 3. Cholelithiasis without acute cholecystitis.  Colonoscopy 08/03/2022 - Diverticulosis in the sigmoid colon and in the descending colon.  - Three 2 to 5 mm polyps in the transverse colon, removed with a cold snare. Resected and retrieved.  - One 2 mm polyp in the ascending colon, removed with a cold snare. Resected and retrieved.  - The examination was otherwise normal on direct and retroflexion views. - No recall due to age  EGD 08/03/2022 - Normal esophagus.  - A single gastric polyp. With evidence for recent bleeding. Biopsied.  - A single periampullary polyp.  This appears larger than on photos from EUS from 6/ 2023. Biopsied.  - The examination was otherwise normal.  Past Medical History:  Diagnosis Date   Allergy    POLLEN   Arthritis    LEFT HIP,HAD SHOTS,BACK    Cancer (HCC)    prostate /renal ca   Cataract    BILATERAL   CKD (chronic kidney disease)    GERD (gastroesophageal reflux disease)    Glaucoma    Hyperlipidemia    Hypertension    Neuromuscular disorder (HCC)    RIGHT FOOT   Renal insufficiency    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   T2DM (type 2 diabetes mellitus) (HCC)     Past Surgical History:  Procedure Laterality Date   BIOPSY  01/26/2022   Procedure: BIOPSY;  Surgeon: Janel Medford, MD;  Location: [facility];  Service: Gastroenterology;;   COLONOSCOPY  04/17/12   Poor prep   COLONOSCOPY  12/20/12   Repeat, 1 Polyp - needs 52yr f/u   ENDOSCOPIC MUCOSAL RESECTION  09/20/2022   Procedure: ENDOSCOPIC MUCOSAL RESECTION;  Surgeon: Normie Becton., MD;  Location: [facility];  Service: Gastroenterology;;   ESOPHAGOGASTRODUODENOSCOPY (EGD) WITH PROPOFOL  N/A 01/26/2022   Procedure: ESOPHAGOGASTRODUODENOSCOPY (EGD) WITH PROPOFOL ;  Surgeon: Janel Medford, MD;  Location: [facility];  Service: Gastroenterology;  Laterality: N/A;   ESOPHAGOGASTRODUODENOSCOPY (EGD) WITH PROPOFOL  N/A 09/20/2022   Procedure: ESOPHAGOGASTRODUODENOSCOPY (EGD) WITH PROPOFOL ;  Surgeon: Brice Campi Albino Alu., MD;  Location: [facility];  Service: Gastroenterology;  Laterality: N/A;   EUS N/A 01/26/2022   Procedure: UPPER ENDOSCOPIC ULTRASOUND (EUS) RADIAL;  Surgeon: Janel Medford, MD;  Location: [facility];  Service: Gastroenterology;  Laterality: N/A;   EUS N/A 09/20/2022   Procedure: UPPER ENDOSCOPIC ULTRASOUND (EUS) RADIAL;  Surgeon: Normie Becton., MD;  Location: [facility];  Service: Gastroenterology;  Laterality: N/A;   FOOT SURGERY     HEMOSTASIS CLIP PLACEMENT  09/20/2022   Procedure: HEMOSTASIS CLIP PLACEMENT;  Surgeon: Brice Campi Albino Alu., MD;  Location: [facility];  Service: Gastroenterology;;   NEPHRECTOMY     SUBMUCOSAL LIFTING INJECTION  09/20/2022   Procedure: SUBMUCOSAL LIFTING INJECTION;  Surgeon:  Normie Becton., MD;  Location: [facility];  Service: Gastroenterology;;    Current Outpatient Medications  Medication Sig Dispense Refill   acetaminophen (TYLENOL) 650 MG CR tablet Take 650 mg by mouth daily.     albuterol  (VENTOLIN  HFA) 108 (90 Base) MCG/ACT inhaler Inhale 1-2 puffs into the lungs every 6 (six) hours as needed for wheezing or shortness of breath. 1 each 0   atorvastatin (LIPITOR) 40 MG tablet Take 40 mg by mouth at bedtime.     azithromycin  (ZITHROMAX ) 250 MG tablet Take 1 tablet (250 mg total) by mouth daily. Take first 2 tablets together, then 1 every day until finished. 6 tablet 0   famotidine (PEPCID) 20 MG tablet Take 20 mg by mouth 2 (two) times daily.     furosemide (LASIX) 40 MG tablet Take 40 mg by mouth every other day.     hydrALAZINE (APRESOLINE) 100 MG tablet Take 100 mg by mouth 2 (two) times daily.     isosorbide mononitrate (IMDUR) 30 MG 24 hr tablet Take 30 mg by mouth daily.     Naphazoline HCl (CLEAR EYES OP) Place 1 drop into both eyes daily as needed (burning).     OVER THE COUNTER MEDICATION Take 2 tablets by mouth daily. NeuroQ     pantoprazole (PROTONIX) 40  MG tablet Take 40 mg by mouth 2 (two) times daily.     primidone (MYSOLINE) 50 MG tablet Take 200 mg by mouth at bedtime.     sennosides-docusate sodium (SENOKOT-S) 8.6-50 MG tablet Take 2 tablets by mouth daily as needed for constipation.     Current Facility-Administered Medications  Medication Dose Route Frequency Provider Last Rate Last Admin   0.9 %  sodium chloride  infusion  500 mL Intravenous Once Lindle Rhea, MD        Allergies as of 02/08/2024   (No Known Allergies)    Family History  Problem Relation Age of Onset   Asthma Mother    Cancer Maternal Grandmother    Cancer Maternal Grandfather    Colon cancer Neg Hx    Stomach cancer Neg Hx    Esophageal cancer Neg Hx    Colon polyps Neg Hx    Crohn's disease Neg Hx    Ulcerative colitis Neg Hx    Rectal  cancer Neg Hx     Social History   Tobacco Use   Smoking status: Never    Passive exposure: Never   Smokeless tobacco: Never  Vaping Use   Vaping status: Never Used  Substance Use Topics   Alcohol use: Never   Drug use: Never     Review of Systems:    Constitutional: No weight loss, fever, chills, weakness or fatigue Eyes: No change in vision Ears, Nose, Throat:  No change in hearing or congestion Skin: No rash or itching Cardiovascular: No chest pain, chest pressure or palpitations   Respiratory: No SOB or cough Gastrointestinal: See HPI and otherwise negative Genitourinary: No dysuria or change in urinary frequency Neurological: No headache, dizziness or syncope Musculoskeletal: No new muscle or joint pain Hematologic: No bleeding or bruising    Physical Exam:  Vital signs: There were no vitals taken for this visit.  Constitutional: NAD, Well developed, Well nourished, alert and cooperative Head:  Normocephalic and atraumatic.  Eyes: No scleral icterus. Conjunctiva pink. Mouth: No oral lesions. Respiratory: Respirations even and unlabored. Lungs clear to auscultation bilaterally.  No wheezes, crackles, or rhonchi.  Cardiovascular:  Regular rate and rhythm. No murmurs. No peripheral edema. Gastrointestinal:  Soft, nondistended, nontender. No rebound or guarding. Normal bowel sounds. No appreciable masses or hepatomegaly. Rectal:  Not performed.  Neurologic:  Alert and oriented x4;  grossly normal neurologically.  Skin:   Dry and intact without significant lesions or rashes. Psychiatric: Oriented to person, place and time. Demonstrates good judgement and reason without abnormal affect or behaviors.   RELEVANT LABS AND IMAGING: CBC    Component Value Date/Time   WBC 7.2 01/12/2024 1754   RBC 2.93 (L) 01/12/2024 1754   HGB 9.7 (L) 01/12/2024 1754   HCT 30.3 (L) 01/12/2024 1754   PLT 162 01/12/2024 1754   MCV 103.4 (H) 01/12/2024 1754   MCH 33.1 01/12/2024 1754    MCHC 32.0 01/12/2024 1754   RDW 13.4 01/12/2024 1754   LYMPHSABS 2.0 01/12/2024 1754   MONOABS 0.9 01/12/2024 1754   EOSABS 0.2 01/12/2024 1754   BASOSABS 0.0 01/12/2024 1754    CMP     Component Value Date/Time   NA 134 (L) 01/12/2024 1754   K 5.0 01/12/2024 1754   CL 107 01/12/2024 1754   CO2 16 (L) 01/12/2024 1754   GLUCOSE 83 01/12/2024 1754   BUN 60 (H) 01/12/2024 1754   CREATININE 4.41 (H) 01/12/2024 1754   CREATININE 1.90 (H) 12/07/2011 1511  CALCIUM 8.7 (L) 01/12/2024 1754   PROT 7.6 01/12/2024 1754   ALBUMIN 4.4 01/12/2024 1754   AST 29 01/12/2024 1754   ALT 34 01/12/2024 1754   ALKPHOS 64 01/12/2024 1754   BILITOT 0.9 01/12/2024 1754   GFRNONAA 13 (L) 01/12/2024 1754   GFRAA (L) 01/16/2008 0515    48        The eGFR has been calculated using the MDRD equation. This calculation has not been validated in all clinical   Echocardiogram 04/18/2021 1. Left ventricular ejection fraction, by estimation, is 65 to 70% . The left ventricle has normal function. The left ventricle has no regional wall motion abnormalities. The left ventricular internal cavity size was mildly dilated. Left ventricular diastolic parameters were normal.  2. Right ventricular systolic function is normal. The right ventricular size is normal.  3. The mitral valve is normal in structure. No evidence of mitral valve regurgitation.  4. The aortic valve is normal in structure. Aortic valve regurgitation is not visualized.  5. The inferior vena cava is normal in size with greater than 50% respiratory variability, suggesting right atrial pressure of 3 mmHg.  Assessment/Plan:    Needs MRI/MRCP   Valiant Gaul, PA-C [facility] 02/07/2024, 4:30 PM  Patient Care Team: Shannan Dart., FNP as PCP - General (Family Medicine) Sheryle Donning, MD as PCP - Cardiology (Cardiology)

## 2024-02-08 ENCOUNTER — Ambulatory Visit: Admitting: Gastroenterology

## 2024-02-14 ENCOUNTER — Ambulatory Visit: Admitting: Gastroenterology

## 2024-03-23 NOTE — Progress Notes (Unsigned)
 [name] [id number] 24-Sep-1945   Chief Complaint:  Referring Provider: Claudene Prentice DELENA Mickey., FNP Primary GI MD:   HPI: [name] is a 78 y.o. [name] with past medical history of GERD, T2DM, HTN, HLD, CKD, renal cancer s/p right nephrectomy, chronic pancreatitis of unclear etiology, cholelithiasis, large gastric polyp and periampullary lesions (removed by Dr. Wilhelmenia 08/2022), chronic active gastritis on biopsies 08/2022, adenomatous colon polyps 07/2022 who presents today for a complaint of GERD.     Last seen in office 10/26/2022 by Vina Dasen, NP for procedure follow-up.  Plan was for MRI/MRCP in one year to follow pancreas duct dilatation.     Previous GI Procedures/Imaging   Upper EUS 09/20/2022 EGD impression:  - No gross lesions in the entire esophagus. Z- line irregular, 39 cm from the incisors.  - 2 cm hiatal hernia.  - Gastric mucosal variant coloration in the antrum noted.  - A single gastric polyp. Resected and retrieved via mucosal resection. Clips ( MR conditional) were placed. Clip manufacturer: [employer].  - Erythematous mucosa in the stomach throughout ( previously biopsied) . - No other gross lesions in the duodenal bulb, in the first portion of the duodenum and in the second portion of the duodenum.  - Normal major papilla with a large intraduodenal portion.  - Mucosal variance in the area just superior and lateral to the major papilla. After EUS completed, this area was biopsied to rule out dysplastic change.   EUS impression:  - Wall thickening was seen in the area of major papilla, within the duodenum. It appeared to primarily be within the deep mucosa ( Layer 2) and did not itself look to be involving the ampulla.  - No evidence of a true intramural ampullary mass was noted on today' s examination.  - Pancreatic parenchymal abnormalities consisting of lobularity with honeycombing and hyperechoic strands were noted in the entire pancreas.  The patient' s pancreas suggests evidence of chronic pancreatitis.  - The pancreatic duct in the pancreatic head and genu were dilated but eventually tapered normally towards the distal neck and body and tail of the pancreas.  - There was prominence of the common bile duct but no evidence of micro choledocholithiasis.  - No malignant- appearing lymph nodes were visualized in the celiac region ( level 20) , peripancreatic region and porta hepatis region. Path: A. STOMACH, POLYPECTOMY:  Hyperplastic polyp with surface erosion and chronic active gastritis  Helicobacter stain negative (IHC, adequate control)  Negative for intestinal metaplasia, dysplasia and carcinoma   B. PERIAMPULAR LESIONS, BIOPSY:  Reactive duodenal mucosa with gastric metaplasia compatible with peptic  duodenitis  Negative for dysplasia and carcinoma    MRI/MRCP 09/11/2022 1. Status post right nephrectomy without evidence of local recurrence or metastatic disease in the abdomen. 2. Similar prominence of the common bile duct and pancreatic duct with smooth tapering to the ampulla without filling defect or focal mass lesions. 3. Cholelithiasis without acute cholecystitis.   Colonoscopy 08/03/2022 - Diverticulosis in the sigmoid colon and in the descending colon.  - Three 2 to 5 mm polyps in the transverse colon, removed with a cold snare. Resected and retrieved.  - One 2 mm polyp in the ascending colon, removed with a cold snare. Resected and retrieved.  - The examination was otherwise normal on direct and retroflexion views. - No recall due to age   EGD 08/03/2022 - Normal esophagus.  - A single gastric polyp. With evidence for recent bleeding. Biopsied.  -  A single periampullary polyp. This appears larger than on photos from EUS from 6/ 2023. Biopsied.  - The examination was otherwise normal.   Past Medical History:  Diagnosis Date   Allergy    POLLEN   Arthritis    LEFT HIP,HAD SHOTS,BACK   Cancer (HCC)     prostate /renal ca   Cataract    BILATERAL   CKD (chronic kidney disease)    GERD (gastroesophageal reflux disease)    Glaucoma    Hyperlipidemia    Hypertension    Neuromuscular disorder (HCC)    RIGHT FOOT   Renal insufficiency    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   T2DM (type 2 diabetes mellitus) (HCC)     Past Surgical History:  Procedure Laterality Date   BIOPSY  01/26/2022   Procedure: BIOPSY;  Surgeon: Teressa Toribio SQUIBB, MD;  Location: [facility];  Service: Gastroenterology;;   COLONOSCOPY  04/17/12   Poor prep   COLONOSCOPY  12/20/12   Repeat, 1 Polyp - needs 59yr f/u   ENDOSCOPIC MUCOSAL RESECTION  09/20/2022   Procedure: ENDOSCOPIC MUCOSAL RESECTION;  Surgeon: Wilhelmenia Aloha Raddle., MD;  Location: [facility];  Service: Gastroenterology;;   ESOPHAGOGASTRODUODENOSCOPY (EGD) WITH PROPOFOL  N/A 01/26/2022   Procedure: ESOPHAGOGASTRODUODENOSCOPY (EGD) WITH PROPOFOL ;  Surgeon: Teressa Toribio SQUIBB, MD;  Location: [facility];  Service: Gastroenterology;  Laterality: N/A;   ESOPHAGOGASTRODUODENOSCOPY (EGD) WITH PROPOFOL  N/A 09/20/2022   Procedure: ESOPHAGOGASTRODUODENOSCOPY (EGD) WITH PROPOFOL ;  Surgeon: Wilhelmenia Aloha Raddle., MD;  Location: [facility];  Service: Gastroenterology;  Laterality: N/A;   EUS N/A 01/26/2022   Procedure: UPPER ENDOSCOPIC ULTRASOUND (EUS) RADIAL;  Surgeon: Teressa Toribio SQUIBB, MD;  Location: [facility];  Service: Gastroenterology;  Laterality: N/A;   EUS N/A 09/20/2022   Procedure: UPPER ENDOSCOPIC ULTRASOUND (EUS) RADIAL;  Surgeon: Wilhelmenia Aloha Raddle., MD;  Location: [facility];  Service: Gastroenterology;  Laterality: N/A;   FOOT SURGERY     HEMOSTASIS CLIP PLACEMENT  09/20/2022   Procedure: HEMOSTASIS CLIP PLACEMENT;  Surgeon: Wilhelmenia Aloha Raddle., MD;  Location: [facility];  Service: Gastroenterology;;   NEPHRECTOMY     SUBMUCOSAL LIFTING INJECTION  09/20/2022   Procedure: SUBMUCOSAL LIFTING INJECTION;  Surgeon: Wilhelmenia Aloha Raddle., MD;  Location: [facility];  Service: Gastroenterology;;    Current Outpatient Medications  Medication Sig Dispense Refill   acetaminophen (TYLENOL) 650 MG CR tablet Take 650 mg by mouth daily.     albuterol  (VENTOLIN  HFA) 108 (90 Base) MCG/ACT inhaler Inhale 1-2 puffs into the lungs every 6 (six) hours as needed for wheezing or shortness of breath. 1 each 0   atorvastatin (LIPITOR) 40 MG tablet Take 40 mg by mouth at bedtime.     azithromycin  (ZITHROMAX ) 250 MG tablet Take 1 tablet (250 mg total) by mouth daily. Take first 2 tablets together, then 1 every day until finished. 6 tablet 0   famotidine  (PEPCID ) 20 MG tablet Take 20 mg by mouth 2 (two) times daily.     furosemide (LASIX) 40 MG tablet Take 40 mg by mouth every other day.     hydrALAZINE (APRESOLINE) 100 MG tablet Take 100 mg by mouth 2 (two) times daily.     isosorbide mononitrate (IMDUR) 30 MG 24 hr tablet Take 30 mg by mouth daily.     Naphazoline HCl (CLEAR EYES OP) Place 1 drop into both eyes daily as needed (burning).     OVER THE COUNTER MEDICATION Take 2 tablets by mouth daily. NeuroQ  pantoprazole  (PROTONIX ) 40 MG tablet Take 40 mg by mouth 2 (two) times daily.     primidone (MYSOLINE) 50 MG tablet Take 200 mg by mouth at bedtime.     sennosides-docusate sodium (SENOKOT-S) 8.6-50 MG tablet Take 2 tablets by mouth daily as needed for constipation.     Current Facility-Administered Medications  Medication Dose Route Frequency Provider Last Rate Last Admin   0.9 %  sodium chloride  infusion  500 mL Intravenous Once Eda Iha, MD        Allergies as of 03/24/2024   (No Known Allergies)    Family History  Problem Relation Age of Onset   Asthma Mother    Cancer Maternal Grandmother    Cancer Maternal Grandfather    Colon cancer Neg Hx    Stomach cancer Neg Hx    Esophageal cancer Neg Hx    Colon polyps Neg Hx    Crohn's disease Neg Hx    Ulcerative colitis Neg Hx    Rectal cancer Neg Hx      Social History   Tobacco Use   Smoking status: Never    Passive exposure: Never   Smokeless tobacco: Never  Vaping Use   Vaping status: Never Used  Substance Use Topics   Alcohol use: Never   Drug use: Never     Review of Systems:    Constitutional: No weight loss, fever, chills, weakness or fatigue Eyes: No change in vision Ears, Nose, Throat:  No change in hearing or congestion Skin: No rash or itching Cardiovascular: No chest pain, chest pressure or palpitations   Respiratory: No SOB or cough Gastrointestinal: See HPI and otherwise negative Genitourinary: No dysuria or change in urinary frequency Neurological: No headache, dizziness or syncope Musculoskeletal: No new muscle or joint pain Hematologic: No bleeding or bruising    Physical Exam:  Vital signs: There were no vitals taken for this visit.  Constitutional: NAD, Well developed, Well nourished, alert and cooperative Head:  Normocephalic and atraumatic.  Eyes: No scleral icterus. Conjunctiva pink. Mouth: No oral lesions. Respiratory: Respirations even and unlabored. Lungs clear to auscultation bilaterally.  No wheezes, crackles, or rhonchi.  Cardiovascular:  Regular rate and rhythm. No murmurs. No peripheral edema. Gastrointestinal:  Soft, nondistended, nontender. No rebound or guarding. Normal bowel sounds. No appreciable masses or hepatomegaly. Rectal:  Not performed.  Neurologic:  Alert and oriented x4;  grossly normal neurologically.  Skin:   Dry and intact without significant lesions or rashes. Psychiatric: Oriented to person, place and time. Demonstrates good judgement and reason without abnormal affect or behaviors.   RELEVANT LABS AND IMAGING: CBC    Component Value Date/Time   WBC 7.2 01/12/2024 1754   RBC 2.93 (L) 01/12/2024 1754   HGB 9.7 (L) 01/12/2024 1754   HCT 30.3 (L) 01/12/2024 1754   PLT 162 01/12/2024 1754   MCV 103.4 (H) 01/12/2024 1754   MCH 33.1 01/12/2024 1754   MCHC 32.0  01/12/2024 1754   RDW 13.4 01/12/2024 1754   LYMPHSABS 2.0 01/12/2024 1754   MONOABS 0.9 01/12/2024 1754   EOSABS 0.2 01/12/2024 1754   BASOSABS 0.0 01/12/2024 1754    CMP     Component Value Date/Time   NA 134 (L) 01/12/2024 1754   K 5.0 01/12/2024 1754   CL 107 01/12/2024 1754   CO2 16 (L) 01/12/2024 1754   GLUCOSE 83 01/12/2024 1754   BUN 60 (H) 01/12/2024 1754   CREATININE 4.41 (H) 01/12/2024 1754   CREATININE 1.90 (H)  12/07/2011 1511   CALCIUM 8.7 (L) 01/12/2024 1754   PROT 7.6 01/12/2024 1754   ALBUMIN 4.4 01/12/2024 1754   AST 29 01/12/2024 1754   ALT 34 01/12/2024 1754   ALKPHOS 64 01/12/2024 1754   BILITOT 0.9 01/12/2024 1754   GFRNONAA 13 (L) 01/12/2024 1754   GFRAA (L) 01/16/2008 0515    48        The eGFR has been calculated using the MDRD equation. This calculation has not been validated in all clinical   Echocardiogram 04/18/2021 1. Left ventricular ejection fraction, by estimation, is 65 to 70% . The left ventricle has normal function. The left ventricle has no regional wall motion abnormalities. The left ventricular internal cavity size was mildly dilated. Left ventricular diastolic parameters were normal.  2. Right ventricular systolic function is normal. The right ventricular size is normal.  3. The mitral valve is normal in structure. No evidence of mitral valve regurgitation.  4. The aortic valve is normal in structure. Aortic valve regurgitation is not visualized.  5. The inferior vena cava is normal in size with greater than 50% respiratory variability, suggesting right atrial pressure of 3 mmHg.  Assessment/Plan:    Needs MRI/MRCP    Camie Furbish, PA-C [facility] 03/23/2024, 7:24 PM  Patient Care Team: Jacob Prentice DELENA Mickey., FNP as PCP - General (Family Medicine) Lonni Slain, MD as PCP - Cardiology (Cardiology)

## 2024-03-24 ENCOUNTER — Encounter: Payer: Self-pay | Admitting: Gastroenterology

## 2024-03-24 ENCOUNTER — Ambulatory Visit: Admitting: Gastroenterology

## 2024-03-24 VITALS — BP 102/60 | HR 64 | Ht 72.0 in | Wt 215.1 lb

## 2024-03-24 DIAGNOSIS — K8689 Other specified diseases of pancreas: Secondary | ICD-10-CM | POA: Diagnosis not present

## 2024-03-24 DIAGNOSIS — K219 Gastro-esophageal reflux disease without esophagitis: Secondary | ICD-10-CM

## 2024-03-24 DIAGNOSIS — K861 Other chronic pancreatitis: Secondary | ICD-10-CM | POA: Diagnosis not present

## 2024-03-24 MED ORDER — PANTOPRAZOLE SODIUM 40 MG PO TBEC
40.0000 mg | DELAYED_RELEASE_TABLET | Freq: Two times a day (BID) | ORAL | 2 refills | Status: DC
Start: 2024-03-24 — End: 2024-05-27

## 2024-03-24 MED ORDER — FAMOTIDINE 20 MG PO TABS: 20.0000 mg | ORAL_TABLET | Freq: Two times a day (BID) | ORAL | 2 refills | Status: AC

## 2024-03-24 NOTE — Patient Instructions (Signed)
 You have been scheduled for an MRI at [facility] on 03/31/24. Your appointment time is 10 am. Please arrive to admitting (at main entrance of the hospital) 30 minutes prior to your appointment time for registration purposes. Please make certain not to have anything to eat or drink 4 hours prior to your test. In addition, if you have any metal in your body, have a pacemaker or defibrillator, please be sure to let your ordering physician know. This test typically takes 45 minutes to 1 hour to complete. Should you need to reschedule, please call [phone] to do so.   We have sent the following medications to your pharmacy for you to pick up at your convenience: Pantoprazole  40 mg twice daily and Famotidine  20 mg twice daily.   _______________________________________________________  If your blood pressure at your visit was 140/90 or greater, please contact your primary care physician to follow up on this.  _______________________________________________________  If you are age 88 or older, your body mass index should be between 23-30. Your Body mass index is 29.18 kg/m. If this is out of the aforementioned range listed, please consider follow up with your Primary Care Provider.  If you are age 41 or younger, your body mass index should be between 19-25. Your Body mass index is 29.18 kg/m. If this is out of the aformentioned range listed, please consider follow up with your Primary Care Provider.   ________________________________________________________  The  GI providers would like to encourage you to use MYCHART to communicate with providers for non-urgent requests or questions.  Due to long hold times on the telephone, sending your provider a message by [facility] may be a faster and more efficient way to get a response.  Please allow 48 business hours for a response.  Please remember that this is for non-urgent requests.   _______________________________________________________  [facility] is using a team-based approach to care.  Your team is made up of your doctor and two to three APPS. Our APPS (Nurse Practitioners and Physician Assistants) work with your physician to ensure care continuity for you. They are fully qualified to address your health concerns and develop a treatment plan. They communicate directly with your gastroenterologist to care for you. Seeing the Advanced Practice Practitioners on your physician's team can help you by facilitating care more promptly, often allowing for earlier appointments, access to diagnostic testing, procedures, and other specialty referrals.

## 2024-03-25 NOTE — Progress Notes (Signed)
 Attending Physician's Attestation   I have reviewed the chart.   I agree with the Advanced Practitioner's note, impression, and recommendations with any updates as below. Updated MRI/MRCP will be helpful in regards to the previously noted PD dilation with signs of chronic pancreatitis.  If clinical symptomatology persist repeat upper endoscopy is reasonable after medication titration as outlined.   Aloha Finner, MD [facility] Advanced Endoscopy Office # [phone]

## 2024-03-28 ENCOUNTER — Telehealth: Payer: Self-pay | Admitting: Gastroenterology

## 2024-03-28 NOTE — Telephone Encounter (Signed)
 Pt called stating he is already taking pantoprazole  40 mg BID. Per Camie Furbish, PA-C, OV note she said to continue the pantoprazole  40 mg bid AND famotidine  20 mg bid. He has been having burning sensation and heartburn but didn't pick up the famotidine . I let him know Camie also mentioned starting Carafate  but he would still like to hold off on this as he has eliminated certain foods and he would like to take the famotidine  and pantoprazole  and see how that goes. He will call us  and let us  know if his symptoms persist.

## 2024-03-28 NOTE — Telephone Encounter (Signed)
 Patient requesting f/u call in regards to pantoprazole  medication. Please advise.

## 2024-03-31 ENCOUNTER — Ambulatory Visit (HOSPITAL_COMMUNITY): Admission: RE | Admit: 2024-03-31 | Source: Ambulatory Visit

## 2024-04-04 ENCOUNTER — Ambulatory Visit (HOSPITAL_COMMUNITY)
Admission: RE | Admit: 2024-04-04 | Discharge: 2024-04-04 | Disposition: A | Discharge status: Home / Self Care | Source: Ambulatory Visit | Attending: Gastroenterology | Admitting: Gastroenterology

## 2024-04-04 ENCOUNTER — Other Ambulatory Visit: Payer: Self-pay | Admitting: Gastroenterology

## 2024-04-04 DIAGNOSIS — K861 Other chronic pancreatitis: Secondary | ICD-10-CM | POA: Diagnosis present

## 2024-04-04 DIAGNOSIS — K8689 Other specified diseases of pancreas: Secondary | ICD-10-CM | POA: Diagnosis present

## 2024-04-04 MED ORDER — GADOBUTROL 1 MMOL/ML IV SOLN
10.0000 mL | Freq: Once | INTRAVENOUS | Status: AC | PRN
  Administered 2024-04-04: 10 mL via INTRAVENOUS

## 2024-04-07 ENCOUNTER — Telehealth: Payer: Self-pay | Admitting: Gastroenterology

## 2024-04-07 ENCOUNTER — Other Ambulatory Visit: Payer: Self-pay

## 2024-04-07 MED ORDER — SUCRALFATE 1 G PO TABS: 1.0000 g | ORAL_TABLET | Freq: Two times a day (BID) | ORAL | 1 refills | Status: AC

## 2024-04-07 NOTE — Telephone Encounter (Signed)
 Pt states he is still having heartburn and using pepto bismol to help with breakthrough sxs. He would like to try the 2 wk course of carafate  and requests it be sent to [facility] in [place].

## 2024-04-07 NOTE — Telephone Encounter (Signed)
 Rx sent. Pt made aware.

## 2024-04-07 NOTE — Telephone Encounter (Signed)
 Please call patient for an update on his symptoms. If having persistent heartburn would advise 2 week course of Carafate.

## 2024-04-10 ENCOUNTER — Ambulatory Visit: Payer: Self-pay | Admitting: Gastroenterology

## 2024-04-23 ENCOUNTER — Other Ambulatory Visit: Payer: Self-pay | Admitting: Urology

## 2024-04-23 DIAGNOSIS — N2889 Other specified disorders of kidney and ureter: Secondary | ICD-10-CM

## 2024-04-30 ENCOUNTER — Ambulatory Visit
Admission: RE | Admit: 2024-04-30 | Discharge: 2024-04-30 | Disposition: A | Discharge status: Home / Self Care | Source: Ambulatory Visit | Attending: Urology | Admitting: Urology

## 2024-04-30 DIAGNOSIS — N2889 Other specified disorders of kidney and ureter: Secondary | ICD-10-CM

## 2024-04-30 HISTORY — PX: IR RADIOLOGIST EVAL & MGMT: IMG5224

## 2024-04-30 NOTE — Consult Note (Signed)
 Chief Complaint: Patient was seen in consultation today for a left renal mass at the request of Gay,Matthew R  Referring Physician(s): Gay,Matthew R  History of Present Illness: [name] is a 78 y.o. male status post prior right nephrectomy in 2008 by Dr. 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 Prior surveillance imaging has been negative for tumor recurrence or any left renal masses until a recent MRI of the abdomen on 04/04/24 performed to follow chronic pancreatitis and dilatation of the pancreatic duct. This demonstrated a 2.1 cm enhancing mass of the posterior lower left kidney and a possible additional 1.4 cm additional posterior mid left renal mass. He is asymptomatic. He does have a history of significant CKD with prior available creatinine as high as 4.41, but with baseline appearing to be more around 2-3. I do not have recent renal function but he is closely followed by Dr. Almarie Bonine at [facility].  Past Medical History:  Diagnosis Date   Allergy    POLLEN   Arthritis    LEFT HIP,HAD SHOTS,BACK   Cancer (HCC)    prostate /renal ca   Cataract    BILATERAL   CKD (chronic kidney disease)    GERD (gastroesophageal reflux disease)    Glaucoma    Hyperlipidemia    Hypertension    Neuromuscular disorder (HCC)    RIGHT FOOT   Renal insufficiency    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   T2DM (type 2 diabetes mellitus) (HCC)     Past Surgical History:  Procedure Laterality Date   BIOPSY  01/26/2022   Procedure: BIOPSY;  Surgeon: Teressa Toribio SQUIBB, MD;  Location: [facility];  Service: Gastroenterology;;   COLONOSCOPY  04/17/12   Poor prep   COLONOSCOPY  12/20/12   Repeat, 1 Polyp - needs 67yr f/u   ENDOSCOPIC MUCOSAL RESECTION  09/20/2022   Procedure: ENDOSCOPIC MUCOSAL RESECTION;  Surgeon: Wilhelmenia Aloha Raddle., MD;  Location: [facility];   Service: Gastroenterology;;   ESOPHAGOGASTRODUODENOSCOPY (EGD) WITH PROPOFOL  N/A 01/26/2022   Procedure: ESOPHAGOGASTRODUODENOSCOPY (EGD) WITH PROPOFOL ;  Surgeon: Teressa Toribio SQUIBB, MD;  Location: [facility];  Service: Gastroenterology;  Laterality: N/A;   ESOPHAGOGASTRODUODENOSCOPY (EGD) WITH PROPOFOL  N/A 09/20/2022   Procedure: ESOPHAGOGASTRODUODENOSCOPY (EGD) WITH PROPOFOL ;  Surgeon: Wilhelmenia Aloha Raddle., MD;  Location: [facility];  Service: Gastroenterology;  Laterality: N/A;   EUS N/A 01/26/2022   Procedure: UPPER ENDOSCOPIC ULTRASOUND (EUS) RADIAL;  Surgeon: Teressa Toribio SQUIBB, MD;  Location: [facility];  Service: Gastroenterology;  Laterality: N/A;   EUS N/A 09/20/2022   Procedure: UPPER ENDOSCOPIC ULTRASOUND (EUS) RADIAL;  Surgeon: Wilhelmenia Aloha Raddle., MD;  Location: [facility];  Service: Gastroenterology;  Laterality: N/A;   FOOT SURGERY     HEMOSTASIS CLIP PLACEMENT  09/20/2022   Procedure: HEMOSTASIS CLIP PLACEMENT;  Surgeon: Wilhelmenia Aloha Raddle., MD;  Location: [facility];  Service: Gastroenterology;;   IR RADIOLOGIST EVAL & MGMT  04/30/2024   NEPHRECTOMY     SUBMUCOSAL LIFTING INJECTION  09/20/2022   Procedure: SUBMUCOSAL LIFTING INJECTION;  Surgeon: Wilhelmenia Aloha Raddle., MD;  Location: [facility];  Service: Gastroenterology;;    Allergies: Patient has no known allergies.  Medications: Prior to Admission medications   Medication Sig Start Date End Date Taking? Authorizing Provider  acetaminophen (TYLENOL) 650 MG CR tablet Take 650 mg by mouth daily.    [provider]  albuterol  (VENTOLIN  HFA) 108 (  90 Base) MCG/ACT inhaler Inhale 1-2 puffs into the lungs every 6 (six) hours as needed for wheezing or shortness of breath. Patient not taking: Reported on 03/24/2024 01/12/24   Bernis Ernst, PA-C  atorvastatin (LIPITOR) 40 MG tablet Take 40 mg by mouth at bedtime.    [provider]  Cholecalciferol (VITAMIN D3) 25 MCG (1000 UT) CAPS Take 1 capsule by  mouth daily. 04/23/13   [provider]  famotidine  (PEPCID ) 20 MG tablet Take 1 tablet (20 mg total) by mouth 2 (two) times daily. 03/24/24   Heinz, Sara E, PA-C  furosemide (LASIX) 40 MG tablet Take 40 mg by mouth every other day.    [provider]  isosorbide mononitrate (IMDUR) 30 MG 24 hr tablet Take 30 mg by mouth daily. Patient not taking: Reported on 03/24/2024    [provider]  Naphazoline HCl (CLEAR EYES OP) Place 1 drop into both eyes daily as needed (burning).    [provider]  OVER THE COUNTER MEDICATION Take 2 tablets by mouth daily. NeuroQ Patient not taking: Reported on 03/24/2024    [provider]  pantoprazole  (PROTONIX ) 40 MG tablet Take 1 tablet (40 mg total) by mouth 2 (two) times daily. 03/24/24   Heinz, Sara E, PA-C  primidone (MYSOLINE) 50 MG tablet Take 200 mg by mouth at bedtime. 09/01/21   [provider]  propranolol (INDERAL) 80 MG tablet Take 80 mg by mouth daily. 03/13/24   [provider]  sennosides-docusate sodium (SENOKOT-S) 8.6-50 MG tablet Take 2 tablets by mouth daily as needed for constipation.    [provider]  sodium bicarbonate 650 MG tablet Take 650 mg by mouth 2 (two) times daily. 08/25/23   [provider]  sucralfate  (CARAFATE ) 1 g tablet Take 1 tablet (1 g total) by mouth 2 (two) times daily for 14 days. 04/07/24 04/21/24  Arletta Camie BRAVO, PA-C     Family History  Problem Relation Age of Onset   Asthma Mother    Cancer Maternal Grandmother    Cancer Maternal Grandfather    Colon cancer Neg Hx    Stomach cancer Neg Hx    Esophageal cancer Neg Hx    Colon polyps Neg Hx    Crohn's disease Neg Hx    Ulcerative colitis Neg Hx    Rectal cancer Neg Hx     Social History   Socioeconomic History   Marital status: Single    Spouse name: Not on file   Number of children: Not on file   Years of education: Not on file   Highest education level: Not on file  Occupational  History   Occupation: retired  Tobacco Use   Smoking status: Never    Passive exposure: Never   Smokeless tobacco: Never  Vaping Use   Vaping status: Never Used  Substance and Sexual Activity   Alcohol use: Never   Drug use: Never   Sexual activity: Not on file  Other Topics Concern   Not on file  Social History Narrative   Not on file   Social Drivers of Health   Financial Resource Strain: Not on file  Food Insecurity: Not on file  Transportation Needs: Not on file  Physical Activity: Not on file  Stress: Not on file  Social Connections: Not on file    ECOG Status: 0 - Asymptomatic  Review of Systems: A 12 point ROS discussed and pertinent positives are indicated in the HPI above.  All other systems  are negative.  Review of Systems  Constitutional: Negative.   Respiratory: Negative.    Cardiovascular:  Positive for leg swelling. Negative for chest pain and palpitations.       Chronic lower extremity edema, R>L  Gastrointestinal: Negative.   Genitourinary: Negative.   Musculoskeletal: Negative.   Neurological: Negative.     Vital Signs: BP (!) 147/65 (BP Location: Left Arm, Patient Position: Sitting, Cuff Size: Normal)   Pulse 67   Temp 97.9 F (36.6 C) (Oral)   Resp 16   SpO2 97%    Physical Exam Vitals reviewed.  Constitutional:      General: He is not in acute distress.    Appearance: He is not ill-appearing, toxic-appearing or diaphoretic.  Cardiovascular:     Rate and Rhythm: Normal rate and regular rhythm.     Heart sounds: Normal heart sounds. No murmur heard.    No friction rub. No gallop.  Pulmonary:     Effort: Pulmonary effort is normal. No respiratory distress.     Breath sounds: Normal breath sounds. No stridor. No wheezing, rhonchi or rales.  Abdominal:     General: There is no distension.     Palpations: Abdomen is soft.     Tenderness: There is no abdominal tenderness. There is no right CVA tenderness, left CVA tenderness, guarding or  rebound.  Musculoskeletal:        General: Swelling present.  Skin:    General: Skin is warm and dry.  Neurological:     General: No focal deficit present.     Mental Status: He is alert and oriented to person, place, and time.      Imaging: IR Radiologist Eval & Mgmt Result Date: 04/30/2024 CLINICAL DATA:  IR clinic consult. EXAM: IR EVAL AND MANAGEMENT COMPARISON:  None Available. FINDINGS: See dictated note in Epic. IMPRESSION: See dictated note in Epic. Electronically Signed   By: Marcey Moan M.D.   On: 04/30/2024 16:37   MR ABDOMEN MRCP W WO CONTAST Result Date: 04/10/2024 CLINICAL DATA:  Chronic pancreatitis, dilatation of the pancreatic duct, history of right nephrectomy for renal cell carcinoma EXAM: MRI ABDOMEN WITHOUT AND WITH CONTRAST (INCLUDING MRCP) TECHNIQUE: Multiplanar multisequence MR imaging of the abdomen was performed both before and after the administration of intravenous contrast. Heavily T2-weighted images of the biliary and pancreatic ducts were obtained, and three-dimensional MRCP images were rendered by post processing. CONTRAST:  10mL GADAVIST  GADOBUTROL  1 MMOL/ML IV SOLN COMPARISON:  09/11/2022 FINDINGS: Lower chest: No acute abnormality. Hepatobiliary: No solid liver abnormality is seen. No gallstones or gallbladder wall thickening. Unchanged biliary ductal dilatation, common bile duct measuring up to 1.0 cm in caliber, tapering to the ampulla without calculus or other obstruction. Pancreas: Unchanged prominence of the central pancreatic duct measuring up to 0.6 cm, tapering to the ampulla without calculus or other obstruction. No pancreatic mass or inflammatory findings. Spleen: Normal in size without significant abnormality. 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 Adrenals/Urinary Tract: Adrenal glands are unremarkable. Status post right nephrectomy. Multiple  simple and thinly septated left renal cortical cysts, unchanged. Enhancing mass in the posterior inferior pole of the left kidney measuring 2.1 x 2.1 cm, which has slowly enlarged over time (series 14, image 64). Additional small enhancing mass chest medial and superior in the posterior midportion of the right kidney which is likewise enlarged over time, measuring 1.4 x 1.4 cm (series 4, image 24). No obvious  calculi. No hydronephrosis. Stomach/Bowel: Stomach is within normal limits. No evidence of bowel wall thickening, distention, or inflammatory changes. Vascular/Lymphatic: No significant vascular findings are present. No enlarged abdominal lymph nodes. Other: No abdominal wall hernia or abnormality. No ascites. Musculoskeletal: No acute or significant osseous findings. IMPRESSION: 1. Unchanged biliary ductal dilatation, common bile duct measuring up to 1.0 cm in caliber, tapering to the ampulla without calculus or other obstruction. 2. Unchanged prominence of the central pancreatic duct measuring up to 0.6 cm, tapering to the ampulla without calculus or other obstruction. 3. Enhancing mass in the posterior inferior pole of the left kidney measuring 2.1 x 2.1 cm, which has slowly enlarged over time, consistent with renal cell carcinoma. 4. Additional small enhancing mass in the posterior midportion of the left kidney which is likewise enlarged over time, measuring 1.4 x 1.4 cm, also consistent with renal cell carcinoma. 5. Status post right nephrectomy. 6. No evidence of lymphadenopathy or metastatic disease in the abdomen. Electronically Signed   By: Marolyn JONETTA Jaksch M.D.   On: 04/10/2024 07:17   MR 3D Recon At Scanner Result Date: 04/10/2024 CLINICAL DATA:  Chronic pancreatitis, dilatation of the pancreatic duct, history of right nephrectomy for renal cell carcinoma EXAM: MRI ABDOMEN WITHOUT AND WITH CONTRAST (INCLUDING MRCP) TECHNIQUE: Multiplanar multisequence MR imaging of the abdomen was performed both  before and after the administration of intravenous contrast. Heavily T2-weighted images of the biliary and pancreatic ducts were obtained, and three-dimensional MRCP images were rendered by post processing. CONTRAST:  10mL GADAVIST  GADOBUTROL  1 MMOL/ML IV SOLN COMPARISON:  09/11/2022 FINDINGS: Lower chest: No acute abnormality. Hepatobiliary: No solid liver abnormality is seen. No gallstones or gallbladder wall thickening. Unchanged biliary ductal dilatation, common bile duct measuring up to 1.0 cm in caliber, tapering to the ampulla without calculus or other obstruction. Pancreas: Unchanged prominence of the central pancreatic duct measuring up to 0.6 cm, tapering to the ampulla without calculus or other obstruction. No pancreatic mass or inflammatory findings. Spleen: Normal in size without significant abnormality. 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 Adrenals/Urinary Tract: Adrenal glands are unremarkable. Status post right nephrectomy. Multiple simple and thinly septated left renal cortical cysts, unchanged. Enhancing mass in the posterior inferior pole of the left kidney measuring 2.1 x 2.1 cm, which has slowly enlarged over time (series 14, image 64). Additional small enhancing mass chest medial and superior in the posterior midportion of the right kidney which is likewise enlarged over time, measuring 1.4 x 1.4 cm (series 4, image 24). No obvious calculi. No hydronephrosis. Stomach/Bowel: Stomach is within normal limits. No evidence of bowel wall thickening, distention, or inflammatory changes. Vascular/Lymphatic: No significant vascular findings are present. No enlarged abdominal lymph nodes. Other: No abdominal wall hernia or abnormality. No ascites. Musculoskeletal: No acute or significant osseous findings. IMPRESSION: 1. Unchanged biliary ductal dilatation, common bile duct measuring up to 1.0 cm in  caliber, tapering to the ampulla without calculus or other obstruction. 2. Unchanged prominence of the central pancreatic duct measuring up to 0.6 cm, tapering to the ampulla without calculus or other obstruction. 3. Enhancing mass in the posterior inferior pole of the left kidney measuring 2.1 x 2.1 cm, which has slowly enlarged over time, consistent with renal cell carcinoma. 4. Additional small enhancing mass in the posterior midportion of the left kidney which is likewise enlarged over time, measuring 1.4 x 1.4 cm, also consistent with renal cell carcinoma. 5. Status  post right nephrectomy. 6. No evidence of lymphadenopathy or metastatic disease in the abdomen. Electronically Signed   By: Marolyn JONETTA Jaksch M.D.   On: 04/10/2024 07:17    Labs:  CBC: Recent Labs    01/12/24 1754  WBC 7.2  HGB 9.7*  HCT 30.3*  PLT 162    COAGS: No results for input(s): INR, APTT in the last 8760 hours.  BMP: Recent Labs    01/12/24 1754  NA 134*  K 5.0  CL 107  CO2 16*  GLUCOSE 83  BUN 60*  CALCIUM 8.7*  CREATININE 4.41*  GFRNONAA 13*    LIVER FUNCTION TESTS: Recent Labs    01/12/24 1754  BILITOT 0.9  AST 29  ALT 34  ALKPHOS 64  PROT 7.6  ALBUMIN 4.4      Assessment and Plan:  I reviewed imaging with [name] including the most recent MRI on 04/04/24. By my measurements, a rounded and enhancing mass of the posterior cortex of the lower left kidney measures approximately 1.8 x 1.9 cm. This lesion is partially exophytic. This was not obvious on prior imaging without contrast and not definitely seen by prior MRI with contrast in 2021. The other lesion described is very difficult to see on the current study and not definitely seen on the pre-contrast images. I am not certain there is a true second lesion in the left kidney.  Given his significant underlying CKD, we discussed options of continued surveillance, biopsy of the larger, more definitive renal lesion and cryoablation. I  suggested we consider biopsy first to try to establish a tissue diagnosis first before subjecting him to ablation to try to preserve as much renal function as possible. After discussing all options, [name] would like to wait and would like to have another follow up imaging study performed prior to making any decisions. I recommended a follow up MRI with and without contrast in 6 months. He is agreeable to this plan. I will meet with him after the follow up MRI is performed.  Thank you for this interesting consult.  I greatly enjoyed meeting [name] and look forward to participating in their care.  A copy of this report was sent to the requesting provider on this date.  Electronically Signed: Marcey ONEIDA Moan 04/30/2024, 4:44 PM    I spent a total of  40 Minutes  in face to face in clinical consultation, greater than 50% of which was counseling/coordinating care for a left renal mass.

## 2024-05-27 ENCOUNTER — Encounter: Payer: Self-pay | Admitting: Gastroenterology

## 2024-05-27 ENCOUNTER — Ambulatory Visit: Admitting: Gastroenterology

## 2024-05-27 VITALS — BP 150/88 | HR 69 | Ht 72.0 in | Wt 232.4 lb

## 2024-05-27 DIAGNOSIS — K8689 Other specified diseases of pancreas: Secondary | ICD-10-CM | POA: Diagnosis not present

## 2024-05-27 DIAGNOSIS — K219 Gastro-esophageal reflux disease without esophagitis: Secondary | ICD-10-CM

## 2024-05-27 DIAGNOSIS — R12 Heartburn: Secondary | ICD-10-CM

## 2024-05-27 DIAGNOSIS — K838 Other specified diseases of biliary tract: Secondary | ICD-10-CM | POA: Diagnosis not present

## 2024-05-27 MED ORDER — RABEPRAZOLE SODIUM 20 MG PO TBEC: 20.0000 mg | DELAYED_RELEASE_TABLET | Freq: Two times a day (BID) | ORAL | 2 refills | Status: AC

## 2024-05-27 NOTE — Patient Instructions (Addendum)
 Stop Pantoprazole .   Start Aciphex 20 mg twice daily.   You will need MRI/MRCP August of 2026. Office will contact you when it is time to schedule.   Follow-up on : 07/09/24 at 11:10 am with Camie Furbish, PA-C  _______________________________________________________  If your blood pressure at your visit was 140/90 or greater, please contact your primary care physician to follow up on this.  _______________________________________________________  If you are age 23 or older, your body mass index should be between 23-30. Your Body mass index is 31.52 kg/m. If this is out of the aforementioned range listed, please consider follow up with your Primary Care Provider.  If you are age 48 or younger, your body mass index should be between 19-25. Your Body mass index is 31.52 kg/m. If this is out of the aformentioned range listed, please consider follow up with your Primary Care Provider.   ________________________________________________________  The [facility] providers would like to encourage you to use MYCHART to communicate with providers for non-urgent requests or questions.  Due to long hold times on the telephone, sending your provider a message by [facility] may be a faster and more efficient way to get a response.  Please allow 48 business hours for a response.  Please remember that this is for non-urgent requests.  _______________________________________________________  [facility] is using a team-based approach to care.  Your team is made up of your doctor and two to three APPS. Our APPS (Nurse Practitioners and Physician Assistants) work with your physician to ensure care continuity for you. They are fully qualified to address your health concerns and develop a treatment plan. They communicate directly with your gastroenterologist to care for you. Seeing the Advanced Practice Practitioners on your physician's team can help you by facilitating care more promptly, often allowing  for earlier appointments, access to diagnostic testing, procedures, and other specialty referrals.   Thank you for choosing me and [facility].  Dr. Wilhelmenia

## 2024-05-27 NOTE — Progress Notes (Unsigned)
 GASTROENTEROLOGY OUTPATIENT CLINIC VISIT   Primary Care Provider Claudene Prentice DELENA Mickey., FNP [address] [place] [address] [phone]  Referring Provider Claudene Prentice DELENA Mickey., FNP [address] [place],  [place] [address] [phone]  Patient Profile: [name] is a 78 y.o. male with a pmh significant for  The patient presents to the [facility] for an evaluation and management of problem(s) noted below:  Problem List No diagnosis found.  History of Present Illness    The patient does/does not take NSAIDs or BC/Goody Powder. Patient has/has not had an EGD. Patient has/has not had a Colonoscopy.  GI Review of Systems Positive as above Negative for  Pyrosis; Reflux; Regurgitation; Dysphagia; Odynophagia; Globus; Post-prandial cough; Nocturnal cough; Nasal regurgitation; Epigastric pain; Nausea; Vomiting; Hematemesis; Jaundice; Change in Appetite; Early satiety; Abdominal pain; Abdominal bloating; Eructation; Flatulence; Change in BM Frequency; Change in BM Consistency; Constipation; Diarrhea; Incontinence; Urgency; Tenesmus; Hematochezia; Melena  Review of Systems General: Denies fevers/chills/weight loss unintentionally Cardiovascular: Denies chest pain Pulmonary: Denies shortness of breath Gastroenterological: See HPI Genitourinary: Denies darkened urine Hematological: Denies easy bruising/bleeding Endocrine: Denies temperature intolerance Dermatological: Denies jaundice Psychological: Mood is stable  Medications Current Outpatient Medications  Medication Sig Dispense Refill   acetaminophen (TYLENOL) 650 MG CR tablet Take 650 mg by mouth daily.     albuterol  (VENTOLIN  HFA) 108 (90 Base) MCG/ACT inhaler Inhale 1-2 puffs into the lungs every 6 (six) hours as needed for wheezing or shortness of breath. (Patient not taking: Reported on 03/24/2024) 1 each 0   atorvastatin (LIPITOR) 40 MG tablet Take 40 mg by mouth at bedtime.      Cholecalciferol (VITAMIN D3) 25 MCG (1000 UT) CAPS Take 1 capsule by mouth daily.     famotidine  (PEPCID ) 20 MG tablet Take 1 tablet (20 mg total) by mouth 2 (two) times daily. 60 tablet 2   furosemide (LASIX) 40 MG tablet Take 40 mg by mouth every other day.     isosorbide mononitrate (IMDUR) 30 MG 24 hr tablet Take 30 mg by mouth daily. (Patient not taking: Reported on 03/24/2024)     Naphazoline HCl (CLEAR EYES OP) Place 1 drop into both eyes daily as needed (burning).     OVER THE COUNTER MEDICATION Take 2 tablets by mouth daily. NeuroQ (Patient not taking: Reported on 03/24/2024)     pantoprazole  (PROTONIX ) 40 MG tablet Take 1 tablet (40 mg total) by mouth 2 (two) times daily. 60 tablet 2   primidone (MYSOLINE) 50 MG tablet Take 200 mg by mouth at bedtime.     propranolol (INDERAL) 80 MG tablet Take 80 mg by mouth daily.     sennosides-docusate sodium (SENOKOT-S) 8.6-50 MG tablet Take 2 tablets by mouth daily as needed for constipation.     sodium bicarbonate 650 MG tablet Take 650 mg by mouth 2 (two) times daily.     sucralfate  (CARAFATE ) 1 g tablet Take 1 tablet (1 g total) by mouth 2 (two) times daily for 14 days. 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   T2DM (type 2 diabetes mellitus) (HCC)    Past Surgical History:  Procedure Laterality Date   BIOPSY  01/26/2022   Procedure:  BIOPSY;  Surgeon: Teressa Toribio SQUIBB, MD;  Location: [facility];  Service: Gastroenterology;;   COLONOSCOPY  04/17/12   Poor prep   COLONOSCOPY  12/20/12   Repeat, 1 Polyp - needs 48yr f/u   ENDOSCOPIC MUCOSAL RESECTION  09/20/2022   Procedure: ENDOSCOPIC MUCOSAL RESECTION;  Surgeon: Wilhelmenia Aloha Raddle., MD;  Location: [facility];  Service: Gastroenterology;;   ESOPHAGOGASTRODUODENOSCOPY (EGD) WITH PROPOFOL  N/A 01/26/2022   Procedure: ESOPHAGOGASTRODUODENOSCOPY (EGD) WITH PROPOFOL ;  Surgeon: Teressa Toribio SQUIBB, MD;  Location: [facility];  Service: Gastroenterology;  Laterality: N/A;   ESOPHAGOGASTRODUODENOSCOPY (EGD) WITH PROPOFOL  N/A 09/20/2022   Procedure: ESOPHAGOGASTRODUODENOSCOPY (EGD) WITH PROPOFOL ;  Surgeon: Wilhelmenia Aloha Raddle., MD;  Location: [facility];  Service: Gastroenterology;  Laterality: N/A;   EUS N/A 01/26/2022   Procedure: UPPER ENDOSCOPIC ULTRASOUND (EUS) RADIAL;  Surgeon: Teressa Toribio SQUIBB, MD;  Location: [facility];  Service: Gastroenterology;  Laterality: N/A;   EUS N/A 09/20/2022   Procedure: UPPER ENDOSCOPIC ULTRASOUND (EUS) RADIAL;  Surgeon: Wilhelmenia Aloha Raddle., MD;  Location: [facility];  Service: Gastroenterology;  Laterality: N/A;   FOOT SURGERY     HEMOSTASIS CLIP PLACEMENT  09/20/2022   Procedure: HEMOSTASIS CLIP PLACEMENT;  Surgeon: Wilhelmenia Aloha Raddle., MD;  Location: [facility];  Service: Gastroenterology;;   IR RADIOLOGIST EVAL & MGMT  04/30/2024   NEPHRECTOMY     SUBMUCOSAL LIFTING INJECTION  09/20/2022   Procedure: SUBMUCOSAL LIFTING INJECTION;  Surgeon: Wilhelmenia Aloha Raddle., MD;  Location: [facility];  Service: Gastroenterology;;   Social History   Socioeconomic History   Marital status: Single    Spouse name: Not on file   Number of children: Not on file   Years of education: Not on file   Highest education level: Not on file  Occupational History   Occupation: retired  Tobacco Use   Smoking status: Never    Passive exposure:  Never   Smokeless tobacco: Never  Vaping Use   Vaping status: Never Used  Substance and Sexual Activity   Alcohol use: Never   Drug use: Never   Sexual activity: Not on file  Other Topics Concern   Not on file  Social History Narrative   Not on file   Social Drivers of Health   Financial Resource Strain: Not on file  Food Insecurity: Not on file  Transportation Needs: Not on file  Physical Activity: Not on file  Stress: Not on file  Social Connections: Not on file  Intimate Partner Violence: Not on file   Family History  Problem Relation Age of Onset   Asthma Mother    Cancer Maternal Grandmother    Cancer Maternal Grandfather    Colon cancer Neg Hx    Stomach cancer Neg Hx    Esophageal cancer Neg Hx    Colon polyps Neg Hx    Crohn's disease Neg Hx    Ulcerative colitis Neg Hx    Rectal cancer Neg Hx    I have reviewed his medical, social, and family history in detail and updated the electronic medical  record as necessary.    PHYSICAL EXAMINATION  There were no vitals taken for this visit. Wt Readings from Last 3 Encounters:  03/24/24 215 lb 2 oz (97.6 kg)  01/12/24 210 lb (95.3 kg)  10/15/23 209 lb (94.8 kg)   GEN: NAD, appears stated age, doesn't appear chronically ill PSYCH: Cooperative, without pressured speech EYE: Conjunctivae pink, sclerae anicteric ENT: MMM CV: Nontachycardic RESP: No audible wheezing GI: NABS, soft, NT/ND, without rebound or guarding, no HSM appreciated GU: DRE shows MSK/EXT: No significant lower extremity edema SKIN: No jaundice, no spider angiomata NEURO:  Alert & Oriented x 3, no focal deficits, no evidence of asterixis   REVIEW OF DATA  I reviewed the following data at the time of this encounter:  GI Procedures and Studies  ***  Laboratory Studies  ***  Imaging Studies  August 2025 MRI/MRCP IMPRESSION: 1. Unchanged biliary ductal dilatation, common bile duct measuring up to 1.0 cm in caliber, tapering to the  ampulla without calculus or other obstruction. 2. Unchanged prominence of the central pancreatic duct measuring up to 0.6 cm, tapering to the ampulla without calculus or other obstruction. 3. Enhancing mass in the posterior inferior pole of the left kidney measuring 2.1 x 2.1 cm, which has slowly enlarged over time, consistent with renal cell carcinoma. 4. Additional small enhancing mass in the posterior midportion of the left kidney which is likewise enlarged over time, measuring 1.4 x 1.4 cm, also consistent with renal cell carcinoma. 5. Status post right nephrectomy. 6. No evidence of lymphadenopathy or metastatic disease in the abdomen.   ASSESSMENT  Mr. Shidler is a 78 y.o. male.  The patient is seen today for evaluation and management of:  No diagnosis found.  ***   PLAN  There are no diagnoses linked to this encounter.   No orders of the defined types were placed in this encounter.   New Prescriptions   No medications on file   Modified Medications   No medications on file    Planned Follow Up No follow-ups on file.   Total Time in Face-to-Face and in Coordination of Care for patient including independent/personal interpretation/review of prior testing, medical history, examination, medication adjustment, communicating results with the patient directly, and documentation within the EHR is ***.   Aloha Finner, MD [facility] Office # [phone]

## 2024-05-30 ENCOUNTER — Encounter: Payer: Self-pay | Admitting: Gastroenterology

## 2024-05-30 DIAGNOSIS — K838 Other specified diseases of biliary tract: Secondary | ICD-10-CM | POA: Insufficient documentation

## 2024-05-30 DIAGNOSIS — K8689 Other specified diseases of pancreas: Secondary | ICD-10-CM | POA: Insufficient documentation

## 2024-05-30 DIAGNOSIS — R12 Heartburn: Secondary | ICD-10-CM | POA: Insufficient documentation

## 2024-07-09 ENCOUNTER — Ambulatory Visit: Admitting: Gastroenterology

## 2024-07-09 NOTE — Progress Notes (Deleted)
 GLENDALE YOUNGBLOOD [id number] 03/03/1946   Chief Complaint:  Referring Provider: Claudene Prentice DELENA Mickey., FNP Primary GI MD: Dr. Wilhelmenia  HPI: [name] is a 78 y.o. male with past medical history of GERD, T2DM, HTN, HLD, CKD, renal cancer s/p right nephrectomy, anemia, chronic pancreatitis of unclear etiology, cholelithiasis, large gastric polyp and periampullary lesions (removed by Dr. Wilhelmenia 08/2022), chronic active gastritis on biopsies 08/2022, adenomatous colon polyps 07/2022 who presents today for a complaint of *** .    Patient seen 09/2021 for chronically elevated amylase and lipase.  He had an EUS 01/2022 showing chronic pancreatitis.  Etiology unknown.  At time of EUS a periampullary nodule was found.  Biopsies negative for dysplasia or malignancy.  EGD showed a large sessile polyp with evidence for recent bleeding in the gastric body.  A single medium sized carpet like polyp with no bleeding was found in the ampulla.  Biopsies of both lesions were negative for cancer. Underwent EUS 08/2022 with finding of hyperplastic gastric polyp and periampullary lesion with biopsies showing reactive duodenal mucosa with gastric metaplasia compatible with peptic duodenitis.  Dr. Wilhelmenia recommended a follow-up MRI/MRCP in 1 year.   Seen 10/26/2022 by Vina Dasen, NP for procedure follow-up.  Plan was for MRI/MRCP in one year to follow pancreas duct dilatation.   Normal fecal pancreatic elastase 12/07/2022.   Seen in the ED 01/12/2024 for complaint of productive cough without hemoptysis.  Labs showed troponin of 5, negative for COVID, flu, RSV.  Creatinine at baseline.  Hemoglobin 9.7, anemia at baseline.  No leukocytosis.  No acute cardiopulmonary abnormality on chest x-ray.  Diagnosed with bronchitis, given 2 DuoNeb's and Solu-Medrol .  Felt better on reevaluation.   Labs 01/12/2024: Hemoglobin 9.7, hematocrit 30.3, MCV 103.4, BUN 60, creatinine 4.41, GFR 13  At last visit 03/24/2024  MRI/MRCP was ordered for follow-up of dilated pancreatic duct.  Patient endorsed heartburn/burning epigastric pain.  Discussed adding on Carafate  but he preferred to continue with PPI twice daily as well as famotidine  twice daily.  Discussed switching PPI versus repeat EGD if symptoms were to persist.  He did call back with a symptom update and was subsequently started on Carafate  as he continued to have problems.  MRCP showed unchanged biliary duct dilation and unchanged prominence of the central pancreatic duct.  However there were also findings of 2 masses in the left kidney, both less than 4 cm and both slowly enlarged over time and consistent with renal cell carcinoma.  Patient has history of right nephrectomy for renal cell carcinoma. There was no evidence of lymphadenopathy or metastatic disease. Patient was called and informed of results and referred to urology for further management.  Had a follow-up visit with Dr. Wilhelmenia 05/27/2024      Discussed the use of AI scribe software for clinical note transcription with the patient, who gave verbal consent to proceed.  History of Present Illness       Previous GI Procedures/Imaging   Upper EUS 09/20/2022 EGD impression:  - No gross lesions in the entire esophagus. Z- line irregular, 39 cm from the incisors.  - 2 cm hiatal hernia.  - Gastric mucosal variant coloration in the antrum noted.  - A single gastric polyp. Resected and retrieved via mucosal resection. Clips ( MR conditional) were placed. Clip manufacturer: [employer].  - Erythematous mucosa in the stomach throughout ( previously biopsied) . - No other gross lesions in the duodenal bulb, in the first portion of the duodenum  and in the second portion of the duodenum.  - Normal major papilla with a large intraduodenal portion.  - Mucosal variance in the area just superior and lateral to the major papilla. After EUS completed, this area was biopsied to rule out  dysplastic change.   EUS impression:  - Wall thickening was seen in the area of major papilla, within the duodenum. It appeared to primarily be within the deep mucosa ( Layer 2) and did not itself look to be involving the ampulla.  - No evidence of a true intramural ampullary mass was noted on today' s examination.  - Pancreatic parenchymal abnormalities consisting of lobularity with honeycombing and hyperechoic strands were noted in the entire pancreas. The patient' s pancreas suggests evidence of chronic pancreatitis.  - The pancreatic duct in the pancreatic head and genu were dilated but eventually tapered normally towards the distal neck and body and tail of the pancreas.  - There was prominence of the common bile duct but no evidence of micro choledocholithiasis.  - No malignant- appearing lymph nodes were visualized in the celiac region ( level 20) , peripancreatic region and porta hepatis region. Path: A. STOMACH, POLYPECTOMY:  Hyperplastic polyp with surface erosion and chronic active gastritis  Helicobacter stain negative ([facility], adequate control)  Negative for intestinal metaplasia, dysplasia and carcinoma   B. PERIAMPULAR LESIONS, BIOPSY:  Reactive duodenal mucosa with gastric metaplasia compatible with peptic  duodenitis  Negative for dysplasia and carcinoma    MRI/MRCP 09/11/2022 1. Status post right nephrectomy without evidence of local recurrence or metastatic disease in the abdomen. 2. Similar prominence of the common bile duct and pancreatic duct with smooth tapering to the ampulla without filling defect or focal mass lesions. 3. Cholelithiasis without acute cholecystitis.   Colonoscopy 08/03/2022 - Diverticulosis in the sigmoid colon and in the descending colon.  - Three 2 to 5 mm polyps in the transverse colon, removed with a cold snare. Resected and retrieved.  - One 2 mm polyp in the ascending colon, removed with a cold snare. Resected and retrieved.  - The  examination was otherwise normal on direct and retroflexion views. - No recall due to age   EGD 08/03/2022 - Normal esophagus.  - A single gastric polyp. With evidence for recent bleeding. Biopsied.  - A single periampullary polyp. This appears larger than on photos from EUS from 6/ 2023. Biopsied.  - The examination was otherwise normal.   EUS 01/26/2022 ENDOSCOPIC FINDING ( with ERCP scope and radial EUS scope) : The examined esophagus was endoscopically normal. The entire examined stomach was endoscopically normal. The major papilla was prominent, directly adjacent to the papillary orifice was a 1 cm nodule. This is possibly a polyp, it does not appear overtly cancerous. I biopsied the nodule with forceps   ENDOSONOGRAPHIC FINDING:   1. Pancreatic parenchyma had diffuse changes of chronic pancreatitis including honeycombing, hyperechoic stranding, lobularity. I did not see any discrete tumors or masses however the changes of chronic pancreatitis can decrease the sensitivity of this exam to find tumors.  2. The main pancreatic duct was dilated in the head and genu, up to 6 mm. The main pancreatic ductthen tapered to normal throughout the rest of the pancreas and has hyperechoic borders.  3. The CBD was slightly dilated at 6 mm. It contained no stones or soft tissue masses.  4. The periampullary nodule described above was barely visible by ultrasound, it seems to correlate with a generous deep mucosal and submucosal layer of  the duodenal wall. Certainly no obvious tumors or masses and no involvement of the muscularis propria.  5. Limited views of the liver, spleen, portal and splenic vessels were all normal   Impression:  - Changes of chronic pancreatitis.  - 1 cm periampullary nodule that does not show signs of deeper mass, no signs of involvement with the muscularis propria. I suspect this is a periampullary duodenal polyp, I biopsied it to exclude cancer.  - Unclear if either of these  findings explain his chronically elevated amylase and lipase ( several hundreds) . He has chronic renal insufficiency ( Cr 3. 7) , perhaps that is playing a role?   Past Medical History:  Diagnosis Date   Allergy    POLLEN   Arthritis    LEFT HIP,HAD SHOTS,BACK   Cancer (HCC)    prostate /renal ca   Cataract    BILATERAL   CKD (chronic kidney disease)    GERD (gastroesophageal reflux disease)    Glaucoma    Hyperlipidemia    Hypertension    Neuromuscular disorder (HCC)    RIGHT FOOT   Renal insufficiency    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   T2DM (type 2 diabetes mellitus) (HCC)     Past Surgical History:  Procedure Laterality Date   BIOPSY  01/26/2022   Procedure: BIOPSY;  Surgeon: Teressa Toribio SQUIBB, MD;  Location: [facility];  Service: Gastroenterology;;   COLONOSCOPY  04/17/12   Poor prep   COLONOSCOPY  12/20/12   Repeat, 1 Polyp - needs 72yr f/u   ENDOSCOPIC MUCOSAL RESECTION  09/20/2022   Procedure: ENDOSCOPIC MUCOSAL RESECTION;  Surgeon: Wilhelmenia Aloha Raddle., MD;  Location: [facility];  Service: Gastroenterology;;   ESOPHAGOGASTRODUODENOSCOPY (EGD) WITH PROPOFOL  N/A 01/26/2022   Procedure: ESOPHAGOGASTRODUODENOSCOPY (EGD) WITH PROPOFOL ;  Surgeon: Teressa Toribio SQUIBB, MD;  Location: [facility];  Service: Gastroenterology;  Laterality: N/A;   ESOPHAGOGASTRODUODENOSCOPY (EGD) WITH PROPOFOL  N/A 09/20/2022   Procedure: ESOPHAGOGASTRODUODENOSCOPY (EGD) WITH PROPOFOL ;  Surgeon: Wilhelmenia Aloha Raddle., MD;  Location: [facility];  Service: Gastroenterology;  Laterality: N/A;   EUS N/A 01/26/2022   Procedure: UPPER ENDOSCOPIC ULTRASOUND (EUS) RADIAL;  Surgeon: Teressa Toribio SQUIBB, MD;  Location: [facility];  Service: Gastroenterology;  Laterality: N/A;   EUS N/A 09/20/2022   Procedure: UPPER ENDOSCOPIC ULTRASOUND (EUS) RADIAL;  Surgeon: Wilhelmenia Aloha Raddle., MD;  Location: [facility];  Service: Gastroenterology;  Laterality: N/A;   FOOT SURGERY     HEMOSTASIS CLIP  PLACEMENT  09/20/2022   Procedure: HEMOSTASIS CLIP PLACEMENT;  Surgeon: Wilhelmenia Aloha Raddle., MD;  Location: [facility];  Service: Gastroenterology;;   IR RADIOLOGIST EVAL & MGMT  04/30/2024   NEPHRECTOMY     SUBMUCOSAL LIFTING INJECTION  09/20/2022   Procedure: SUBMUCOSAL LIFTING INJECTION;  Surgeon: Wilhelmenia Aloha Raddle., MD;  Location: [facility];  Service: Gastroenterology;;    Current Outpatient Medications  Medication Sig Dispense Refill   acetaminophen (TYLENOL) 650 MG CR tablet Take 650 mg by mouth daily.     albuterol  (VENTOLIN  HFA) 108 (90 Base) MCG/ACT inhaler Inhale 1-2 puffs into the lungs every 6 (six) hours as needed for wheezing or shortness of breath. 1 each 0   Ascorbic Acid (VITAMIN C) 1000 MG tablet Take 1,000 mg by mouth daily.     atorvastatin (LIPITOR) 40 MG tablet Take 40 mg by mouth at bedtime.     Cholecalciferol (VITAMIN D3) 25 MCG (1000 UT) CAPS Take 1 capsule by mouth daily.     famotidine  (  PEPCID ) 20 MG tablet Take 1 tablet (20 mg total) by mouth 2 (two) times daily. 60 tablet 2   furosemide (LASIX) 40 MG tablet Take 40 mg by mouth every other day.     hydrALAZINE (APRESOLINE) 50 MG tablet Take 50 mg by mouth 2 (two) times daily.     multivitamin (RENA-VIT) TABS tablet Take 1 tablet by mouth daily.     Naphazoline HCl (CLEAR EYES OP) Place 1 drop into both eyes daily as needed (burning).     primidone (MYSOLINE) 50 MG tablet Take 200 mg by mouth at bedtime.     propranolol (INDERAL) 80 MG tablet Take 80 mg by mouth daily.     RABEprazole (ACIPHEX) 20 MG tablet Take 1 tablet (20 mg total) by mouth 2 (two) times daily before a meal. 60 tablet 2   sennosides-docusate sodium (SENOKOT-S) 8.6-50 MG tablet Take 2 tablets by mouth daily as needed for constipation. (Patient not taking: Reported on 05/27/2024)     sodium bicarbonate 650 MG tablet Take 650 mg by mouth 2 (two) times daily.     sucralfate  (CARAFATE ) 1 g tablet Take 1 tablet (1 g total) by mouth 2 (two)  times daily for 14 days. (Patient not taking: Reported on 05/27/2024) 28 tablet 1   vitamin E 180 MG (400 UNITS) capsule Take 400 Units by mouth daily.     Current Facility-Administered Medications  Medication Dose Route Frequency Provider Last Rate Last Admin   0.9 %  sodium chloride  infusion  500 mL Intravenous Once Eda Iha, MD        Allergies as of 07/09/2024   (No Known Allergies)    Family History  Problem Relation Age of Onset   Asthma Mother    Cancer Maternal Grandmother    Cancer Maternal Grandfather    Colon cancer Neg Hx    Stomach cancer Neg Hx    Esophageal cancer Neg Hx    Colon polyps Neg Hx    Crohn's disease Neg Hx    Ulcerative colitis Neg Hx    Rectal cancer Neg Hx    Inflammatory bowel disease Neg Hx    Liver disease Neg Hx    Pancreatic cancer Neg Hx     Social History   Tobacco Use   Smoking status: Never    Passive exposure: Never   Smokeless tobacco: Never  Vaping Use   Vaping status: Never Used  Substance Use Topics   Alcohol use: Never   Drug use: Never     Review of Systems:    Constitutional: No weight loss, fever, chills, weakness or fatigue Eyes: No change in vision Ears, Nose, Throat:  No change in hearing or congestion Skin: No rash or itching Cardiovascular: No chest pain, chest pressure or palpitations   Respiratory: No SOB or cough Gastrointestinal: See HPI and otherwise negative Genitourinary: No dysuria or change in urinary frequency Neurological: No headache, dizziness or syncope Musculoskeletal: No new muscle or joint pain Hematologic: No bleeding or bruising    Physical Exam:  [name] signs: There were no vitals taken for this visit.  Constitutional: NAD, Well developed, Well nourished, alert and cooperative Head:  Normocephalic and atraumatic.  Eyes: No scleral icterus. Conjunctiva pink. Mouth: No oral lesions. Respiratory: Respirations even and unlabored. Lungs clear to auscultation bilaterally.  No  wheezes, crackles, or rhonchi.  Cardiovascular:  Regular rate and rhythm. No murmurs. No peripheral edema. Gastrointestinal:  Soft, nondistended, nontender. No rebound or guarding. Normal bowel sounds. No  appreciable masses or hepatomegaly. Rectal:  Not performed.  Neurologic:  Alert and oriented x4;  grossly normal neurologically.  Skin:   Dry and intact without significant lesions or rashes. Psychiatric: Oriented to person, place and time. Demonstrates good judgement and reason without abnormal affect or behaviors.   RELEVANT LABS AND IMAGING: CBC    Component Value Date/Time   WBC 7.2 01/12/2024 1754   RBC 2.93 (L) 01/12/2024 1754   HGB 9.7 (L) 01/12/2024 1754   HCT 30.3 (L) 01/12/2024 1754   PLT 162 01/12/2024 1754   MCV 103.4 (H) 01/12/2024 1754   MCH 33.1 01/12/2024 1754   MCHC 32.0 01/12/2024 1754   RDW 13.4 01/12/2024 1754   LYMPHSABS 2.0 01/12/2024 1754   MONOABS 0.9 01/12/2024 1754   EOSABS 0.2 01/12/2024 1754   BASOSABS 0.0 01/12/2024 1754    CMP     Component Value Date/Time   NA 134 (L) 01/12/2024 1754   K 5.0 01/12/2024 1754   CL 107 01/12/2024 1754   CO2 16 (L) 01/12/2024 1754   GLUCOSE 83 01/12/2024 1754   BUN 60 (H) 01/12/2024 1754   CREATININE 4.41 (H) 01/12/2024 1754   CREATININE 1.90 (H) 12/07/2011 1511   CALCIUM 8.7 (L) 01/12/2024 1754   PROT 7.6 01/12/2024 1754   ALBUMIN 4.4 01/12/2024 1754   AST 29 01/12/2024 1754   ALT 34 01/12/2024 1754   ALKPHOS 64 01/12/2024 1754   BILITOT 0.9 01/12/2024 1754   GFRNONAA 13 (L) 01/12/2024 1754   GFRAA (L) 01/16/2008 0515    48        The eGFR has been calculated using the MDRD equation. This calculation has not been validated in all clinical   Echocardiogram 04/18/2021 1. Left ventricular ejection fraction, by estimation, is 65 to 70% . The left ventricle has normal function. The left ventricle has no regional wall motion abnormalities. The left ventricular internal cavity size was mildly dilated. Left  ventricular diastolic parameters were normal.  2. Right ventricular systolic function is normal. The right ventricular size is normal.  3. The mitral valve is normal in structure. No evidence of mitral valve regurgitation.  4. The aortic valve is normal in structure. Aortic valve regurgitation is not visualized.  5. The inferior vena cava is normal in size with greater than 50% respiratory variability, suggesting right atrial pressure of 3 mmHg.  Assessment/Plan:    Assessment and Plan Assessment & Plan        Camie Furbish, PA-C [facility] 07/09/2024, 11:01 AM  Patient Care Team: Claudene Prentice DELENA Mickey., FNP as PCP - General (Family Medicine) Lonni Slain, MD as PCP - Cardiology (Cardiology)

## 2024-07-11 ENCOUNTER — Encounter: Payer: Self-pay | Admitting: Family Medicine

## 2024-07-11 ENCOUNTER — Other Ambulatory Visit: Payer: Self-pay | Admitting: Family Medicine

## 2024-07-11 DIAGNOSIS — R519 Headache, unspecified: Secondary | ICD-10-CM

## 2024-07-17 ENCOUNTER — Ambulatory Visit
Admission: RE | Admit: 2024-07-17 | Discharge: 2024-07-17 | Disposition: A | Discharge status: Home / Self Care | Source: Ambulatory Visit | Attending: Family Medicine | Admitting: Family Medicine

## 2024-07-17 DIAGNOSIS — R519 Headache, unspecified: Secondary | ICD-10-CM

## 2024-09-29 ENCOUNTER — Emergency Department (HOSPITAL_COMMUNITY)
Admission: EM | Admit: 2024-09-29 | Discharge: 2024-09-29 | Disposition: A | Discharge status: Home / Self Care | Attending: Emergency Medicine | Admitting: Emergency Medicine

## 2024-09-29 ENCOUNTER — Emergency Department (HOSPITAL_COMMUNITY)

## 2024-09-29 DIAGNOSIS — M79672 Pain in left foot: Secondary | ICD-10-CM | POA: Insufficient documentation

## 2024-09-29 DIAGNOSIS — D631 Anemia in chronic kidney disease: Secondary | ICD-10-CM | POA: Insufficient documentation

## 2024-09-29 DIAGNOSIS — Z89421 Acquired absence of other right toe(s): Secondary | ICD-10-CM | POA: Insufficient documentation

## 2024-09-29 DIAGNOSIS — Z89411 Acquired absence of right great toe: Secondary | ICD-10-CM | POA: Insufficient documentation

## 2024-09-29 DIAGNOSIS — M7989 Other specified soft tissue disorders: Secondary | ICD-10-CM | POA: Insufficient documentation

## 2024-09-29 DIAGNOSIS — N189 Chronic kidney disease, unspecified: Secondary | ICD-10-CM | POA: Insufficient documentation

## 2024-09-29 DIAGNOSIS — M79675 Pain in left toe(s): Secondary | ICD-10-CM | POA: Insufficient documentation

## 2024-09-29 DIAGNOSIS — E875 Hyperkalemia: Secondary | ICD-10-CM | POA: Insufficient documentation

## 2024-09-29 LAB — CBC
HCT: 30 % — ABNORMAL LOW (ref 39.0–52.0)
Hemoglobin: 9.7 g/dL — ABNORMAL LOW (ref 13.0–17.0)
MCH: 33.2 pg (ref 26.0–34.0)
MCHC: 32.3 g/dL (ref 30.0–36.0)
MCV: 102.7 fL — ABNORMAL HIGH (ref 80.0–100.0)
Platelets: 181 10*3/uL (ref 150–400)
RBC: 2.92 MIL/uL — ABNORMAL LOW (ref 4.22–5.81)
RDW: 12.6 % (ref 11.5–15.5)
WBC: 5.8 10*3/uL (ref 4.0–10.5)
nRBC: 0 % (ref 0.0–0.2)

## 2024-09-29 LAB — BASIC METABOLIC PANEL WITH GFR
Anion gap: 12 (ref 5–15)
BUN: 58 mg/dL — ABNORMAL HIGH (ref 8–23)
CO2: 18 mmol/L — ABNORMAL LOW (ref 22–32)
Calcium: 9 mg/dL (ref 8.9–10.3)
Chloride: 108 mmol/L (ref 98–111)
Creatinine, Ser: 4.72 mg/dL — ABNORMAL HIGH (ref 0.61–1.24)
GFR, Estimated: 12 mL/min — ABNORMAL LOW
Glucose, Bld: 79 mg/dL (ref 70–99)
Potassium: 5.2 mmol/L — ABNORMAL HIGH (ref 3.5–5.1)
Sodium: 138 mmol/L (ref 135–145)

## 2024-09-29 LAB — URIC ACID: Uric Acid, Serum: 6.8 mg/dL (ref 3.7–8.6)

## 2024-09-29 MED ORDER — OXYCODONE HCL 5 MG PO TABS: 5.0000 mg | ORAL_TABLET | Freq: Two times a day (BID) | ORAL | 0 refills | Status: AC | PRN

## 2024-09-29 MED ORDER — PREDNISONE 10 MG PO TABS: 40.0000 mg | ORAL_TABLET | Freq: Every day | ORAL | 0 refills | Status: AC

## 2024-09-29 MED ORDER — PREDNISONE 20 MG PO TABS
60.0000 mg | ORAL_TABLET | Freq: Once | ORAL | Status: AC
  Administered 2024-09-29: 60 mg via ORAL
  Filled 2024-09-29: qty 3

## 2024-09-29 NOTE — ED Notes (Signed)
 Pt provided discharge instructions and prescription information. Pt was given the opportunity to ask questions and questions were answered.

## 2024-09-29 NOTE — Discharge Instructions (Addendum)
 You should return to the emergency department if you have new or worsening pain in your feet, redness spreading up your legs, or fevers.
